# Patient Record
Sex: Female | Born: 1960 | Race: White | Hispanic: No | Marital: Married | Smoking: Former smoker
Health system: Southern US, Community
[De-identification: ages and names within clinical notes are randomized; demographics above are authoritative.]

## PROBLEM LIST (undated history)

## (undated) DIAGNOSIS — C801 Malignant (primary) neoplasm, unspecified: Secondary | ICD-10-CM

## (undated) DIAGNOSIS — I10 Essential (primary) hypertension: Secondary | ICD-10-CM

## (undated) DIAGNOSIS — Z923 Personal history of irradiation: Secondary | ICD-10-CM

## (undated) HISTORY — DX: Malignant (primary) neoplasm, unspecified: C80.1

## (undated) HISTORY — DX: Essential (primary) hypertension: I10

## (undated) HISTORY — PX: HERNIA REPAIR: SHX51

## (undated) HISTORY — PX: BREAST BIOPSY: SHX20

---

## 1998-06-07 ENCOUNTER — Encounter: Admission: RE | Admit: 1998-06-07 | Discharge: 1998-06-07 | Payer: Self-pay | Admitting: Family Medicine

## 1998-06-28 ENCOUNTER — Encounter: Admission: RE | Admit: 1998-06-28 | Discharge: 1998-06-28 | Payer: Self-pay | Admitting: Sports Medicine

## 1998-09-23 ENCOUNTER — Other Ambulatory Visit: Admission: RE | Admit: 1998-09-23 | Discharge: 1998-09-23 | Payer: Self-pay | Admitting: *Deleted

## 1999-02-14 ENCOUNTER — Encounter (HOSPITAL_COMMUNITY): Admission: RE | Admit: 1999-02-14 | Discharge: 1999-04-08 | Payer: Self-pay | Admitting: *Deleted

## 1999-03-09 ENCOUNTER — Inpatient Hospital Stay (HOSPITAL_COMMUNITY): Admission: AD | Admit: 1999-03-09 | Discharge: 1999-03-09 | Payer: Self-pay | Admitting: *Deleted

## 1999-03-14 ENCOUNTER — Ambulatory Visit (HOSPITAL_COMMUNITY): Admission: RE | Admit: 1999-03-14 | Discharge: 1999-03-14 | Payer: Self-pay | Admitting: Obstetrics and Gynecology

## 1999-04-04 ENCOUNTER — Inpatient Hospital Stay (HOSPITAL_COMMUNITY): Admission: AD | Admit: 1999-04-04 | Discharge: 1999-04-04 | Payer: Self-pay | Admitting: Obstetrics and Gynecology

## 1999-04-07 ENCOUNTER — Inpatient Hospital Stay (HOSPITAL_COMMUNITY): Admission: AD | Admit: 1999-04-07 | Discharge: 1999-04-09 | Payer: Self-pay | Admitting: *Deleted

## 1999-04-10 ENCOUNTER — Encounter (HOSPITAL_COMMUNITY): Admission: RE | Admit: 1999-04-10 | Discharge: 1999-07-09 | Payer: Self-pay | Admitting: *Deleted

## 1999-05-14 ENCOUNTER — Other Ambulatory Visit: Admission: RE | Admit: 1999-05-14 | Discharge: 1999-05-14 | Payer: Self-pay | Admitting: *Deleted

## 1999-07-16 ENCOUNTER — Encounter (HOSPITAL_COMMUNITY): Admission: RE | Admit: 1999-07-16 | Discharge: 1999-10-14 | Payer: Self-pay | Admitting: *Deleted

## 1999-10-12 ENCOUNTER — Encounter (HOSPITAL_COMMUNITY): Admission: RE | Admit: 1999-10-12 | Discharge: 2000-01-10 | Payer: Self-pay | Admitting: *Deleted

## 2000-06-16 ENCOUNTER — Other Ambulatory Visit: Admission: RE | Admit: 2000-06-16 | Discharge: 2000-06-16 | Payer: Self-pay | Admitting: *Deleted

## 2000-11-03 ENCOUNTER — Encounter: Admission: RE | Admit: 2000-11-03 | Discharge: 2000-11-03 | Payer: Self-pay | Admitting: Obstetrics & Gynecology

## 2000-11-03 ENCOUNTER — Encounter: Payer: Self-pay | Admitting: Obstetrics & Gynecology

## 2001-06-28 ENCOUNTER — Other Ambulatory Visit: Admission: RE | Admit: 2001-06-28 | Discharge: 2001-06-28 | Payer: Self-pay | Admitting: *Deleted

## 2002-07-18 ENCOUNTER — Other Ambulatory Visit: Admission: RE | Admit: 2002-07-18 | Discharge: 2002-07-18 | Payer: Self-pay | Admitting: Obstetrics & Gynecology

## 2003-03-07 ENCOUNTER — Encounter: Admission: RE | Admit: 2003-03-07 | Discharge: 2003-03-07 | Payer: Self-pay | Admitting: Obstetrics & Gynecology

## 2003-03-07 ENCOUNTER — Encounter: Payer: Self-pay | Admitting: Obstetrics & Gynecology

## 2003-09-04 ENCOUNTER — Other Ambulatory Visit: Admission: RE | Admit: 2003-09-04 | Discharge: 2003-09-04 | Payer: Self-pay | Admitting: Obstetrics & Gynecology

## 2003-09-10 ENCOUNTER — Encounter: Admission: RE | Admit: 2003-09-10 | Discharge: 2003-09-10 | Payer: Self-pay | Admitting: Obstetrics & Gynecology

## 2003-10-04 ENCOUNTER — Ambulatory Visit (HOSPITAL_COMMUNITY): Admission: RE | Admit: 2003-10-04 | Discharge: 2003-10-04 | Payer: Self-pay | Admitting: Gastroenterology

## 2004-06-11 ENCOUNTER — Encounter: Admission: RE | Admit: 2004-06-11 | Discharge: 2004-06-11 | Payer: Self-pay | Admitting: Obstetrics & Gynecology

## 2005-07-14 ENCOUNTER — Encounter: Admission: RE | Admit: 2005-07-14 | Discharge: 2005-07-14 | Payer: Self-pay | Admitting: Obstetrics & Gynecology

## 2005-07-27 ENCOUNTER — Encounter: Admission: RE | Admit: 2005-07-27 | Discharge: 2005-07-27 | Payer: Self-pay | Admitting: Obstetrics & Gynecology

## 2005-08-21 ENCOUNTER — Encounter: Admission: RE | Admit: 2005-08-21 | Discharge: 2005-08-21 | Payer: Self-pay | Admitting: Internal Medicine

## 2006-01-26 ENCOUNTER — Ambulatory Visit: Payer: Self-pay | Admitting: Sports Medicine

## 2006-07-22 ENCOUNTER — Encounter: Admission: RE | Admit: 2006-07-22 | Discharge: 2006-07-22 | Payer: Self-pay | Admitting: Obstetrics & Gynecology

## 2006-07-27 ENCOUNTER — Encounter: Admission: RE | Admit: 2006-07-27 | Discharge: 2006-07-27 | Payer: Self-pay | Admitting: Obstetrics & Gynecology

## 2007-01-05 ENCOUNTER — Encounter: Admission: RE | Admit: 2007-01-05 | Discharge: 2007-01-05 | Payer: Self-pay | Admitting: Obstetrics & Gynecology

## 2007-07-25 ENCOUNTER — Encounter: Admission: RE | Admit: 2007-07-25 | Discharge: 2007-07-25 | Payer: Self-pay | Admitting: Obstetrics & Gynecology

## 2008-08-27 ENCOUNTER — Encounter: Admission: RE | Admit: 2008-08-27 | Discharge: 2008-08-27 | Payer: Self-pay | Admitting: Internal Medicine

## 2008-08-27 ENCOUNTER — Encounter: Admission: RE | Admit: 2008-08-27 | Discharge: 2008-08-27 | Payer: Self-pay | Admitting: Obstetrics & Gynecology

## 2009-01-02 ENCOUNTER — Ambulatory Visit: Payer: Self-pay | Admitting: Sports Medicine

## 2009-01-02 ENCOUNTER — Encounter: Admission: RE | Admit: 2009-01-02 | Discharge: 2009-01-02 | Payer: Self-pay | Admitting: Sports Medicine

## 2009-01-02 DIAGNOSIS — M25559 Pain in unspecified hip: Secondary | ICD-10-CM

## 2009-01-02 DIAGNOSIS — M2170 Unequal limb length (acquired), unspecified site: Secondary | ICD-10-CM

## 2009-01-17 ENCOUNTER — Ambulatory Visit: Payer: Self-pay | Admitting: Sports Medicine

## 2009-11-02 DIAGNOSIS — C50919 Malignant neoplasm of unspecified site of unspecified female breast: Secondary | ICD-10-CM

## 2009-11-02 HISTORY — PX: BREAST LUMPECTOMY: SHX2

## 2009-11-02 HISTORY — DX: Malignant neoplasm of unspecified site of unspecified female breast: C50.919

## 2010-05-26 ENCOUNTER — Emergency Department (HOSPITAL_COMMUNITY): Admission: EM | Admit: 2010-05-26 | Discharge: 2010-05-26 | Payer: Self-pay | Admitting: Emergency Medicine

## 2010-07-30 ENCOUNTER — Encounter: Admission: RE | Admit: 2010-07-30 | Discharge: 2010-07-30 | Payer: Self-pay | Admitting: Obstetrics & Gynecology

## 2010-08-12 ENCOUNTER — Encounter: Admission: RE | Admit: 2010-08-12 | Discharge: 2010-08-12 | Payer: Self-pay | Admitting: Obstetrics & Gynecology

## 2010-08-13 DIAGNOSIS — C801 Malignant (primary) neoplasm, unspecified: Secondary | ICD-10-CM

## 2010-08-13 HISTORY — DX: Malignant (primary) neoplasm, unspecified: C80.1

## 2010-08-17 ENCOUNTER — Encounter: Admission: RE | Admit: 2010-08-17 | Discharge: 2010-08-17 | Payer: Self-pay | Admitting: Obstetrics & Gynecology

## 2010-08-21 ENCOUNTER — Encounter: Admission: RE | Admit: 2010-08-21 | Discharge: 2010-08-21 | Payer: Self-pay | Admitting: Obstetrics & Gynecology

## 2010-08-26 ENCOUNTER — Encounter: Admission: RE | Admit: 2010-08-26 | Discharge: 2010-08-26 | Payer: Self-pay | Admitting: General Surgery

## 2010-08-28 ENCOUNTER — Ambulatory Visit (HOSPITAL_BASED_OUTPATIENT_CLINIC_OR_DEPARTMENT_OTHER): Admission: RE | Admit: 2010-08-28 | Discharge: 2010-08-28 | Payer: Self-pay | Admitting: General Surgery

## 2010-08-28 ENCOUNTER — Encounter: Admission: RE | Admit: 2010-08-28 | Discharge: 2010-08-28 | Payer: Self-pay | Admitting: General Surgery

## 2010-08-28 HISTORY — PX: BREAST SURGERY: SHX581

## 2010-09-02 ENCOUNTER — Ambulatory Visit: Payer: Self-pay | Admitting: Oncology

## 2010-09-11 LAB — CBC WITH DIFFERENTIAL/PLATELET
BASO%: 0.4 % (ref 0.0–2.0)
HCT: 38.2 % (ref 34.8–46.6)
MCHC: 34.5 g/dL (ref 31.5–36.0)
MONO#: 0.4 10*3/uL (ref 0.1–0.9)
RBC: 4.18 10*6/uL (ref 3.70–5.45)
WBC: 8.1 10*3/uL (ref 3.9–10.3)
lymph#: 2 10*3/uL (ref 0.9–3.3)

## 2010-09-11 LAB — COMPREHENSIVE METABOLIC PANEL
ALT: 11 U/L (ref 0–35)
CO2: 23 mEq/L (ref 19–32)
Calcium: 9.7 mg/dL (ref 8.4–10.5)
Chloride: 105 mEq/L (ref 96–112)
Sodium: 140 mEq/L (ref 135–145)
Total Protein: 7.3 g/dL (ref 6.0–8.3)

## 2010-09-11 LAB — CANCER ANTIGEN 27.29: CA 27.29: 30 U/mL (ref 0–39)

## 2010-09-16 ENCOUNTER — Ambulatory Visit
Admission: RE | Admit: 2010-09-16 | Discharge: 2010-11-18 | Payer: Self-pay | Source: Home / Self Care | Attending: Radiation Oncology | Admitting: Radiation Oncology

## 2010-09-17 ENCOUNTER — Ambulatory Visit (HOSPITAL_COMMUNITY): Admission: RE | Admit: 2010-09-17 | Discharge: 2010-09-17 | Payer: Self-pay | Admitting: Oncology

## 2010-10-06 ENCOUNTER — Ambulatory Visit: Payer: Self-pay | Admitting: Oncology

## 2010-10-13 ENCOUNTER — Ambulatory Visit: Payer: Self-pay | Admitting: Genetic Counselor

## 2010-11-12 ENCOUNTER — Ambulatory Visit: Payer: Self-pay | Admitting: Oncology

## 2010-11-22 ENCOUNTER — Encounter: Payer: Self-pay | Admitting: Obstetrics & Gynecology

## 2010-11-23 ENCOUNTER — Encounter: Payer: Self-pay | Admitting: Obstetrics & Gynecology

## 2010-12-18 ENCOUNTER — Ambulatory Visit: Payer: 59 | Attending: Radiation Oncology | Admitting: Radiation Oncology

## 2010-12-19 ENCOUNTER — Other Ambulatory Visit: Payer: Self-pay | Admitting: Radiation Oncology

## 2010-12-19 DIAGNOSIS — Z9889 Other specified postprocedural states: Secondary | ICD-10-CM

## 2011-01-02 ENCOUNTER — Other Ambulatory Visit: Payer: Self-pay | Admitting: Obstetrics & Gynecology

## 2011-01-06 ENCOUNTER — Encounter (HOSPITAL_BASED_OUTPATIENT_CLINIC_OR_DEPARTMENT_OTHER): Payer: BC Managed Care – PPO | Admitting: Oncology

## 2011-01-06 ENCOUNTER — Other Ambulatory Visit: Payer: Self-pay | Admitting: Oncology

## 2011-01-06 DIAGNOSIS — C50919 Malignant neoplasm of unspecified site of unspecified female breast: Secondary | ICD-10-CM

## 2011-01-06 LAB — CBC WITH DIFFERENTIAL/PLATELET
Basophils Absolute: 0 10*3/uL (ref 0.0–0.1)
Eosinophils Absolute: 0.2 10*3/uL (ref 0.0–0.5)
HGB: 12.7 g/dL (ref 11.6–15.9)
MCV: 89.1 fL (ref 79.5–101.0)
MONO#: 0.4 10*3/uL (ref 0.1–0.9)
MONO%: 8.1 % (ref 0.0–14.0)
NEUT#: 2.8 10*3/uL (ref 1.5–6.5)
RBC: 4.06 10*6/uL (ref 3.70–5.45)
RDW: 13.2 % (ref 11.2–14.5)
WBC: 4.3 10*3/uL (ref 3.9–10.3)
lymph#: 0.9 10*3/uL (ref 0.9–3.3)

## 2011-01-06 LAB — COMPREHENSIVE METABOLIC PANEL
Albumin: 4.3 g/dL (ref 3.5–5.2)
Alkaline Phosphatase: 30 U/L — ABNORMAL LOW (ref 39–117)
Calcium: 9.2 mg/dL (ref 8.4–10.5)
Chloride: 106 mEq/L (ref 96–112)
Glucose, Bld: 111 mg/dL — ABNORMAL HIGH (ref 70–99)
Potassium: 4.4 mEq/L (ref 3.5–5.3)
Sodium: 139 mEq/L (ref 135–145)
Total Protein: 6.6 g/dL (ref 6.0–8.3)

## 2011-01-12 ENCOUNTER — Other Ambulatory Visit: Payer: Self-pay | Admitting: Obstetrics & Gynecology

## 2011-01-13 ENCOUNTER — Encounter (HOSPITAL_BASED_OUTPATIENT_CLINIC_OR_DEPARTMENT_OTHER): Payer: BC Managed Care – PPO | Admitting: Oncology

## 2011-01-13 DIAGNOSIS — C50919 Malignant neoplasm of unspecified site of unspecified female breast: Secondary | ICD-10-CM

## 2011-03-02 ENCOUNTER — Encounter (INDEPENDENT_AMBULATORY_CARE_PROVIDER_SITE_OTHER): Payer: Self-pay | Admitting: General Surgery

## 2011-03-04 ENCOUNTER — Other Ambulatory Visit: Payer: Self-pay | Admitting: Obstetrics & Gynecology

## 2011-03-04 ENCOUNTER — Encounter (HOSPITAL_COMMUNITY): Payer: BC Managed Care – PPO

## 2011-03-04 LAB — COMPREHENSIVE METABOLIC PANEL
Albumin: 3.6 g/dL (ref 3.5–5.2)
BUN: 12 mg/dL (ref 6–23)
Creatinine, Ser: 0.68 mg/dL (ref 0.4–1.2)
GFR calc Af Amer: >60 mL/min (ref >60)
Potassium: 4.4 mEq/L (ref 3.5–5.1)
Total Protein: 6.6 g/dL (ref 6.0–8.3)

## 2011-03-04 LAB — CBC
MCH: 29.8 pg (ref 26.0–34.0)
MCV: 91.3 fL (ref 78.0–100.0)
Platelets: 242 10*3/uL (ref 150–400)
RDW: 13 % (ref 11.5–15.5)
WBC: 5.2 10*3/uL (ref 4.0–10.5)

## 2011-03-04 LAB — SURGICAL PCR SCREEN: MRSA, PCR: NEGATIVE

## 2011-03-11 ENCOUNTER — Other Ambulatory Visit: Payer: Self-pay | Admitting: Obstetrics & Gynecology

## 2011-03-11 ENCOUNTER — Ambulatory Visit (HOSPITAL_COMMUNITY)
Admission: AD | Admit: 2011-03-11 | Discharge: 2011-03-11 | Disposition: A | Discharge status: Home / Self Care | Payer: BC Managed Care – PPO | Source: Ambulatory Visit | Attending: Obstetrics & Gynecology | Admitting: Obstetrics & Gynecology

## 2011-03-11 DIAGNOSIS — Z170 Estrogen receptor positive status [ER+]: Secondary | ICD-10-CM | POA: Insufficient documentation

## 2011-03-11 DIAGNOSIS — Z8041 Family history of malignant neoplasm of ovary: Secondary | ICD-10-CM | POA: Insufficient documentation

## 2011-03-11 DIAGNOSIS — Z853 Personal history of malignant neoplasm of breast: Secondary | ICD-10-CM | POA: Insufficient documentation

## 2011-03-11 DIAGNOSIS — Z4002 Encounter for prophylactic removal of ovary: Secondary | ICD-10-CM | POA: Insufficient documentation

## 2011-03-11 DIAGNOSIS — Z01818 Encounter for other preprocedural examination: Secondary | ICD-10-CM | POA: Insufficient documentation

## 2011-03-11 DIAGNOSIS — Z01812 Encounter for preprocedural laboratory examination: Secondary | ICD-10-CM | POA: Insufficient documentation

## 2011-03-11 DIAGNOSIS — N83209 Unspecified ovarian cyst, unspecified side: Secondary | ICD-10-CM | POA: Insufficient documentation

## 2011-03-11 LAB — PREGNANCY, URINE: Preg Test, Ur: NEGATIVE

## 2011-03-11 LAB — ABO/RH: ABO/RH(D): A POS

## 2011-03-15 LAB — CROSSMATCH: Unit division: 0

## 2011-03-20 NOTE — Op Note (Signed)
NAMEKENLEIGH, Dana Moran                         ACCOUNT NO.:  [id number]   MEDICAL RECORD NO.:  [id number]                   PATIENT TYPE:  AMB   LOCATION:  ENDO                                 FACILITY:   PHYSICIAN:  Anselmo Rod, M.D.               DATE OF BIRTH:  December 03, 1960   DATE OF PROCEDURE:  10/04/2003  DATE OF DISCHARGE:                                 OPERATIVE REPORT   PROCEDURE PERFORMED:  Screening colonoscopy.   ENDOSCOPIST:  Anselmo Rod, M.D.   INSTRUMENT USED:  Olympus video colonoscope.   INDICATIONS FOR PROCEDURE:  Rectal bleeding with guaiac-positive stools on  physical examination in the office and abnormal digital examination.  Question extrinsic compression of the rectum versus rectal polyp and family  history of colon cancer in a 50 year old white female.  Rule out colonic  polyps, masses, etc.   PREPROCEDURE PREPARATION:  Informed consent was procured from the patient.  The patient was fasted for eight hours prior to procedure and prepped with a  bottle of magnesium citrate and gallon of GoLYTELY the night prior to  procedure.   PREPROCEDURE PHYSICAL:  VITAL SIGNS:  The patient has stable vital signs.  NECK:  Supple.  CHEST:  Clear to auscultation.  CARDIAC:  S1, S2 regular.  ABDOMEN:  Soft with normal bowel sounds.   DESCRIPTION OF PROCEDURE:  The patient was placed in the left lateral  decubitus position and sedated with 80 mg of Demerol and 8 mg of Versed in  slow incremental doses.  Once the patient was adequately sedated and  maintained on low-flow oxygen and continuous cardiac monitoring, the Olympus  video colonoscope was advanced from the rectum to the cecum without  difficulty except for some extrinsic compression of the rectum.  No other  abnormalities were noted up to the cecum.  The appendicular orifice and  ileocecal valve were clearly visualized and photographed.  Small internal  hemorrhoids were seen on retroflexion in the  rectum.   IMPRESSION:  1. Normal colonoscopy up to the cecum except for small internal hemorrhoids.  2. Extrinsic compression of the rectum.   RECOMMENDATIONS:  1. Repeat CRC screening as recommended in the next five years unless the     patient develops any abnormal symptoms in the interim.  2. Repeat guaiac on outpatient basis.  3. Pelvic ultrasound with next GYN evaluation to evaluate extrinsic     compression of rectum.  This can be done at Dr. Sharol Roussel office.  4.     Outpatient followup in the next four weeks for further recommendations.     Continue high-fiber diet with liberal fluid intake.                                               Jyothi Nat  Loreta Ave, M.D.    JNM/MEDQ  D:  10/04/2003  T:  10/05/2003  Job:  [id number]   cc:   Genia Del, M.D.  [address]  [place]  [place] [address]  Fax: [phone]

## 2011-04-01 NOTE — Op Note (Signed)
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  PROCEDURE:  Bilateral salpingo-oophorectomy with lysis of adhesions, peritoneal washings assisted with [employer] robot.  SURGEON:  Genia Del, MD  ASSISTANT:  Arlan Organ, MD  ANESTHESIOLOGIST:  Quillian Quince, MD  PROCEDURE:  Under general anesthesia with endotracheal intubation, the patient is in lithotomy position.  She is prepped with Surgi-Prep on the abdomen and Betadine on the suprapubic, vulvar, and vaginal areas.  The Foley is put in place in the bladder, and the patient is draped as usual.  The vaginal exam reveals a retroverted uterus, normal volume. No adnexal mass felt.  The weighted speculum was inserted in the vagina. The anterior lip of the cervix was grasped with a tenaculum.  The hysterometry is at 9 cm.  We dilated the cervix with Hegar dilators up to #25 without difficulty.  We then used a #8 Rumi with a small KOH ring.  This is put in place as usual without problems.  We removed the tenaculum and speculum.  We go to the abdomen.  We go at the palmar point.  We infiltrated the subcutaneous tissue with Marcaine 0.25% plain 5 mL.  We made a 5-mm incision with a scalpel.  The Veress needle was inserted.  Security tests are done and a pneumoperitoneum  was created with about 3 liters of CO2.  We then removed the Veress needle.  We inserted the 5-mm trocar and the 5-mm camera at that level.  No adhesion is present with the anterior wall in the abdomen or pelvis.  The uterus is normal in appearance.  We therefore inserted all the other ports.  We used an M configuration, and we put only two robotic arms.  We made the measurements.  We infiltrated the skin with Marcaine 0.25% plain, a total of an additional 10 mL.  We used a scalpel to make a 10-mm incision at the supraumbilical area, and 8-mm incisions on the right and the left.  We inserted all trocars under direct vision.  We then put the patient in Trendelenburg.  We used the Nezhat to do peritoneal washings, and this will be sent to cytology.  We then removed the instruments.  We docked the robot on the right side.  We put the EndoShears scissor on the first arm and the PK in the second arm.  We then go to the console. We released the sigmoid colon on the left side.  Fine adhesions were present.  We visualized the left ureter very  very well.  It is in normal anatomic position.  We have just a very filmy adhesion between the right ovary in the right pelvic wall.  This was released with the EndoShears scissors.  We cauterized and sectioned the infundibulopelvic ligament as distally as possible.  We then had coag, and sectioned the left utero- ovarian ligament and the proximal aspect of the left tube.  We completely detached the left adnexa and put it in the posterior cul-de- sac.  Hemostasis was adequate on that side.  We then proceed exactly the same way on the right side.  There is a simple cyst measuring about 3 cm is present.  It drains during the procedure and a clear fluid evacuates. The right ureter is in normal anatomic position.  We cauterized and sectioned the right infundibulopelvic ligament as distally as possible. We cauterized and sectioned the right utero-ovarian ligament,  the right tube proximally, and we completely detached the right adnexa.  When we finished detaching the right adnexa, the ovary was adherent to the ovarian fossa and that was freed very carefully.  We put the specimen in the cul-de-sac.  Hemostasis was verified.  We irrigated and suctioned. We used the tip of the EndoShears scissors and PK to complete fine hemostasis.  We then removed robotic instruments, undocked the robot. We go by laparoscopy with a 5-mm camera in the assistant port, and the Endobag in the suprapubic port.  We removed both adnexa in the bag and sent it to Pathology.  Hemostasis was adequate at all levels. Irrigation and suction was done.  We removed all instruments, removed the trocars, and evacuated the CO2.  We closed the supraumbilical incision with a running suture of Vicryl 0 at the aponeurosis.  We closed all incisions with a subcuticular stitch of Vicryl 4-0 and apply Dermabond on all incisions.  We also removed the KOH ring and Rumi from the vagina.  The estimated blood loss was minimal.  The patient received Ancef 1 g IV before induction.  The count of instruments and sponges was complete.  No complications occurred, and she was brought to recovery room in good stable status.     Genia Del, M.D.     ML/MEDQ  D:  03/11/2011  T:  03/12/2011  Job:  [id number]  Electronically Signed by Genia Del M.D. on 04/01/2011 05:04:22 PM

## 2011-04-09 ENCOUNTER — Other Ambulatory Visit: Payer: Self-pay | Admitting: Oncology

## 2011-04-09 ENCOUNTER — Encounter (HOSPITAL_BASED_OUTPATIENT_CLINIC_OR_DEPARTMENT_OTHER): Payer: BC Managed Care – PPO | Admitting: Oncology

## 2011-04-09 DIAGNOSIS — C50919 Malignant neoplasm of unspecified site of unspecified female breast: Secondary | ICD-10-CM

## 2011-04-09 LAB — COMPREHENSIVE METABOLIC PANEL
ALT: 16 U/L (ref 0–35)
AST: 15 U/L (ref 0–37)
Albumin: 4.3 g/dL (ref 3.5–5.2)
Alkaline Phosphatase: 41 U/L (ref 39–117)
Potassium: 4.7 mEq/L (ref 3.5–5.3)
Sodium: 140 mEq/L (ref 135–145)
Total Protein: 6.7 g/dL (ref 6.0–8.3)

## 2011-04-09 LAB — CBC WITH DIFFERENTIAL/PLATELET
EOS%: 4.9 % (ref 0.0–7.0)
MCH: 31.1 pg (ref 25.1–34.0)
MCV: 89.7 fL (ref 79.5–101.0)
MONO%: 7.2 % (ref 0.0–14.0)
NEUT#: 2.5 10*3/uL (ref 1.5–6.5)
RBC: 4.13 10*6/uL (ref 3.70–5.45)
RDW: 12.9 % (ref 11.2–14.5)
lymph#: 1.2 10*3/uL (ref 0.9–3.3)

## 2011-04-10 ENCOUNTER — Other Ambulatory Visit: Payer: Self-pay | Admitting: Dermatology

## 2011-04-16 ENCOUNTER — Other Ambulatory Visit: Payer: Self-pay | Admitting: Oncology

## 2011-04-16 ENCOUNTER — Other Ambulatory Visit: Payer: Self-pay | Admitting: Radiation Oncology

## 2011-04-16 ENCOUNTER — Encounter (HOSPITAL_BASED_OUTPATIENT_CLINIC_OR_DEPARTMENT_OTHER): Payer: BC Managed Care – PPO | Admitting: Oncology

## 2011-04-16 DIAGNOSIS — Z170 Estrogen receptor positive status [ER+]: Secondary | ICD-10-CM

## 2011-04-16 DIAGNOSIS — C50919 Malignant neoplasm of unspecified site of unspecified female breast: Secondary | ICD-10-CM

## 2011-04-16 DIAGNOSIS — N6459 Other signs and symptoms in breast: Secondary | ICD-10-CM

## 2011-04-16 DIAGNOSIS — R232 Flushing: Secondary | ICD-10-CM

## 2011-04-16 DIAGNOSIS — C50419 Malignant neoplasm of upper-outer quadrant of unspecified female breast: Secondary | ICD-10-CM

## 2011-04-23 ENCOUNTER — Ambulatory Visit
Admission: RE | Admit: 2011-04-23 | Discharge: 2011-04-23 | Disposition: A | Discharge status: Home / Self Care | Payer: BC Managed Care – PPO | Source: Ambulatory Visit | Attending: Oncology | Admitting: Oncology

## 2011-04-23 DIAGNOSIS — C50919 Malignant neoplasm of unspecified site of unspecified female breast: Secondary | ICD-10-CM

## 2011-05-12 ENCOUNTER — Other Ambulatory Visit: Payer: Self-pay | Admitting: Dermatology

## 2011-06-16 ENCOUNTER — Encounter (HOSPITAL_BASED_OUTPATIENT_CLINIC_OR_DEPARTMENT_OTHER): Payer: BC Managed Care – PPO | Admitting: Oncology

## 2011-06-16 DIAGNOSIS — R232 Flushing: Secondary | ICD-10-CM

## 2011-06-16 DIAGNOSIS — Z170 Estrogen receptor positive status [ER+]: Secondary | ICD-10-CM

## 2011-06-16 DIAGNOSIS — C50419 Malignant neoplasm of upper-outer quadrant of unspecified female breast: Secondary | ICD-10-CM

## 2011-08-12 ENCOUNTER — Other Ambulatory Visit: Payer: Self-pay | Admitting: Oncology

## 2011-08-12 DIAGNOSIS — Z853 Personal history of malignant neoplasm of breast: Secondary | ICD-10-CM

## 2011-08-12 DIAGNOSIS — Z9889 Other specified postprocedural states: Secondary | ICD-10-CM

## 2011-08-17 ENCOUNTER — Other Ambulatory Visit: Payer: Self-pay | Admitting: Oncology

## 2011-08-17 ENCOUNTER — Ambulatory Visit
Admission: RE | Admit: 2011-08-17 | Discharge: 2011-08-17 | Disposition: A | Discharge status: Home / Self Care | Payer: BC Managed Care – PPO | Source: Ambulatory Visit | Attending: Radiation Oncology | Admitting: Radiation Oncology

## 2011-08-17 DIAGNOSIS — R921 Mammographic calcification found on diagnostic imaging of breast: Secondary | ICD-10-CM

## 2011-08-17 DIAGNOSIS — Z9889 Other specified postprocedural states: Secondary | ICD-10-CM

## 2011-08-18 ENCOUNTER — Ambulatory Visit
Admission: RE | Admit: 2011-08-18 | Discharge: 2011-08-18 | Disposition: A | Discharge status: Home / Self Care | Payer: BC Managed Care – PPO | Source: Ambulatory Visit | Attending: Oncology | Admitting: Oncology

## 2011-08-18 ENCOUNTER — Other Ambulatory Visit: Payer: Self-pay | Admitting: Oncology

## 2011-08-18 DIAGNOSIS — R921 Mammographic calcification found on diagnostic imaging of breast: Secondary | ICD-10-CM

## 2011-08-18 DIAGNOSIS — Z9889 Other specified postprocedural states: Secondary | ICD-10-CM

## 2011-08-20 ENCOUNTER — Ambulatory Visit
Admission: RE | Admit: 2011-08-20 | Discharge: 2011-08-20 | Disposition: A | Discharge status: Home / Self Care | Payer: BC Managed Care – PPO | Source: Ambulatory Visit | Attending: Radiation Oncology | Admitting: Radiation Oncology

## 2011-09-04 ENCOUNTER — Ambulatory Visit
Admission: RE | Admit: 2011-09-04 | Discharge: 2011-09-04 | Disposition: A | Discharge status: Home / Self Care | Payer: BC Managed Care – PPO | Source: Ambulatory Visit | Attending: Oncology | Admitting: Oncology

## 2011-09-04 DIAGNOSIS — Z9889 Other specified postprocedural states: Secondary | ICD-10-CM

## 2011-09-04 DIAGNOSIS — Z853 Personal history of malignant neoplasm of breast: Secondary | ICD-10-CM

## 2011-09-04 MED ORDER — GADOBENATE DIMEGLUMINE 529 MG/ML IV SOLN: 11.0000 mL | Freq: Once | INTRAVENOUS | Status: AC | PRN

## 2011-10-14 ENCOUNTER — Encounter: Payer: Self-pay | Admitting: Sports Medicine

## 2011-10-14 ENCOUNTER — Ambulatory Visit (INDEPENDENT_AMBULATORY_CARE_PROVIDER_SITE_OTHER): Payer: BC Managed Care – PPO | Admitting: Sports Medicine

## 2011-10-14 VITALS — BP 126/84 | HR 72 | Ht 62.5 in | Wt 122.0 lb

## 2011-10-14 DIAGNOSIS — M25512 Pain in left shoulder: Secondary | ICD-10-CM | POA: Insufficient documentation

## 2011-10-14 DIAGNOSIS — M25519 Pain in unspecified shoulder: Secondary | ICD-10-CM

## 2011-10-14 DIAGNOSIS — M719 Bursopathy, unspecified: Secondary | ICD-10-CM

## 2011-10-14 DIAGNOSIS — M75102 Unspecified rotator cuff tear or rupture of left shoulder, not specified as traumatic: Secondary | ICD-10-CM | POA: Insufficient documentation

## 2011-10-14 MED ORDER — NITROGLYCERIN 0.2 MG/HR TD PT24: MEDICATED_PATCH | TRANSDERMAL | Status: DC

## 2011-10-14 NOTE — Assessment & Plan Note (Signed)
She will use very light Thera-Band and motion exercises at first since easy internal rotation does cause her some pain  When necessary use of Advil orally

## 2011-10-14 NOTE — Patient Instructions (Signed)
You have a small tear in your left supraspinatus tendon in your left shoulder  Please start applying 1/4 of a nitroglycerin patch to your left shoulder everyday   Nitroglycerin patches may cause headache for the first 1-2 weeks, it is ok to take ibuprofen for this  Do suggested exercises for shoulder daily  Please follow up in 4-6 weeks for re-scan of your shoulder  Thank you for seeing [place] today!

## 2011-10-14 NOTE — Progress Notes (Signed)
  Subjective:    Patient ID: Dana Moran, female    DOB: 15-Sep-1961, 50 y.o.   MRN: [id number]  HPI  Pt presents to clinic for evaluation of bilat shoulder pain L>R. Started 6 months ago, no injury. L shoulder aches most of the time- wakes her at night due to pain.  Has significant pain with abduction and ER on lt. Rt shoulder pops and catches, but pain is not severe.  Yoga and walking are main exercise. Takes Advil for pain as needed, which is helpful.   Dx with breast CA 14 months ago, was on tamoxifen for 6 months, now on letrozole for the last 6 months. Had lumpectomy on left, also had oophorectomy 2/2 family hx of ovarian CA.   Review of Systems     Objective:   Physical Exam  Shoulder appearance is symmetrical No scapular asymmetry  Speeds neg  bicepital groove non tender Yergason's neg Empty can neg  Hawkins slightly painful on lt  Neer's painful on lt, neg on rt  Back scratch limited on lt  IR and ER strong bilat IR at 90 deg slightly painful on lt O'brein's neg   [facility] ultrasound The left shoulder is visualized and the infraspinous and teres minor are normal The bicipital tendon is normal Supraspinalis is normal AC joint is normal  In the subscapularis there is a small linear split up that looks to be about a half a centimeter in length and it has some associated calcification There is some hypoechoic change with this This does not gap or impinge with motion      Assessment & Plan:

## 2011-10-14 NOTE — Assessment & Plan Note (Signed)
After a couple weeks of easy motion exercises we will start her on standard rotator cuff rehabilitation exercises with very light weight  We will start her on nitroglycerin protocol  She was warned about possible side effects  Recheck this in 6 weeks with a repeat scan

## 2011-11-18 ENCOUNTER — Ambulatory Visit (INDEPENDENT_AMBULATORY_CARE_PROVIDER_SITE_OTHER): Payer: BC Managed Care – PPO | Admitting: Sports Medicine

## 2011-11-18 DIAGNOSIS — M67919 Unspecified disorder of synovium and tendon, unspecified shoulder: Secondary | ICD-10-CM

## 2011-11-18 DIAGNOSIS — M75102 Unspecified rotator cuff tear or rupture of left shoulder, not specified as traumatic: Secondary | ICD-10-CM

## 2011-11-18 MED ORDER — NITROGLYCERIN 0.2 MG/HR TD PT24: MEDICATED_PATCH | TRANSDERMAL | Status: DC

## 2011-11-18 NOTE — Assessment & Plan Note (Signed)
With pain at night. Known small tear in the subscapularis tendon. Cortisone shot as above. Increase nitroglycerin to one half patch. We will see her back in 4 weeks. She will continue her home rehabilitation, but avoid exercises with arm in abduction.

## 2011-11-18 NOTE — Progress Notes (Signed)
  Subjective:    Patient ID: Dana Moran, female    DOB: 1961-06-12, 51 y.o.   MRN: [id number]  HPI This patient comes in for followup of left shoulder pain that she's been having for some time now. She has a diagnosis of a small tear in the subscapularis tendon. She's been using one quarter nitroglycerin patch, as well as some anti-inflammatories. She's never had a corticosteroid injection. Really she is waking up at night with the pain, and is overall worse.  She thinks she may have aggravated this with RC rehab exercises in ER  Original injury was from adducted shoulder strength move in yoga   Review of Systems    No fevers, chills, night sweats, weight loss, chest pain, or shortness of breath.  Objective:   Physical Exam General:  Well developed, well nourished, and in no acute distress. Neuro:  Alert and oriented x3, extra-ocular muscles intact. Skin: Warm and dry, no rashes noted.  Left shoulder: Positive Neer, positive Hawkins, positive empty can without weakness. Negative speed, negative Yergason. Pain with resisted internal rotation with a positive liftoff test. Good range of motion otherwise. Negative O'Brien's. Negative crossarm.  [facility] ultrasound: Again we see a small tear in the subscapularis tendon, and this is much smaller. Other RC MM looks intact Bicipital tendon normal  Consent obtained and verified. Time-out conducted. Noted no overlying erythema, induration, or other signs of local infection. Skin prepped in a sterile fashion. Topical analgesic spray: Ethyl chloride. Joint: Left subacromial joint Needle: 25-gauge Completed without difficulty. Meds: 1 cc Depo-Medrol 40, 3 cc lidocaine Pain immediately improved suggesting accurate placement of the medication. Advised to call if fevers/chills, erythema, induration, drainage, or persistent bleeding.     Assessment & Plan:

## 2011-11-18 NOTE — Patient Instructions (Signed)
Great to see you. Your subscapularis tear is better. Please rehabilitation exercises but avoid exercises with the arm significantly behind. Increased your nitroglycerin to one half patch. Come back to see [place] in 4 weeks.

## 2011-11-25 ENCOUNTER — Ambulatory Visit (INDEPENDENT_AMBULATORY_CARE_PROVIDER_SITE_OTHER): Payer: Self-pay | Admitting: General Surgery

## 2011-12-16 ENCOUNTER — Ambulatory Visit: Payer: BC Managed Care – PPO | Admitting: Sports Medicine

## 2011-12-23 ENCOUNTER — Encounter: Payer: Self-pay | Admitting: Sports Medicine

## 2011-12-23 ENCOUNTER — Ambulatory Visit (INDEPENDENT_AMBULATORY_CARE_PROVIDER_SITE_OTHER): Payer: BC Managed Care – PPO | Admitting: Sports Medicine

## 2011-12-23 VITALS — BP 141/86 | HR 67

## 2011-12-23 DIAGNOSIS — M75102 Unspecified rotator cuff tear or rupture of left shoulder, not specified as traumatic: Secondary | ICD-10-CM

## 2011-12-23 DIAGNOSIS — M25512 Pain in left shoulder: Secondary | ICD-10-CM

## 2011-12-23 DIAGNOSIS — M25519 Pain in unspecified shoulder: Secondary | ICD-10-CM

## 2011-12-23 DIAGNOSIS — M67919 Unspecified disorder of synovium and tendon, unspecified shoulder: Secondary | ICD-10-CM

## 2011-12-23 NOTE — Patient Instructions (Addendum)
Start doing shoulder range of motion exercises - do them to mild pain only  We will send a referral for physical therapy.  They will call you to schedule your appointment  Please follow up after you have had 8 PT sessions- in 4-5 weeks  Thank you for seeing [place] today!

## 2011-12-23 NOTE — Assessment & Plan Note (Addendum)
This has improved clinically from the level of having less pain and the ultrasound the area of the subscapularis muscle tear appears to be healed.  We will continue the nitroglycerin patches for at least 4 more weeks  She has developed limited range of motion -  This may be early evidence for adhesive capsulitis following her tear She is given home range of motion exercises in the water to refer her to physical therapy to try to improve her motion She will need to be cautious with strength work until her motion improves  I am concerned that some of the tightness in the axillary area may be residual from her she has had breast cancer surgery  I want to recheck her in 4-5 weeks after she has had 8 sessions of physical therapy

## 2011-12-23 NOTE — Progress Notes (Signed)
  Subjective:    Patient ID: Dana Moran, female    DOB: May 04, 1961, 51 y.o.   MRN: [id number]  HPI  Pt presents to clinic for f/u of lt shoulder pain which she reports is 50% improved. Using NTG 1/2 patch daily without problems. Does home exercises daily. Takes ibuprofen as needed which helps. Has pain with abduction and elevation above 90 deg. No longer wakes up 2/2 shoulder pain, only wakes her if she lays on her shoulder too long.   Review of Systems     Objective:   Physical Exam  NAD Lt shoulder exam: Lacks few degrees of full flexion on lt ante Full abduction and elevation but has tightness in axilla Limited back scratch on lt Good ER and IR strength at 90 deg Good IR and ER strength at 90 deg  Elevation strength good Good strength on push off test Limited ER, and abduction at 45 deg when lying flat on lt, but not on rt Gets to 70 deg ER on lt, and 90 deg on rt  [facility] ultrasound The area of hypoechoic change in splitting in the subscapularis tendon appears to be healed Bicipital tendon unremarkable Infraspinatus supraspinatus and teres minor look normal A.c. joint normal Ultrasound evaluation is essentially normalized        Assessment & Plan:

## 2012-01-04 ENCOUNTER — Ambulatory Visit: Payer: BC Managed Care – PPO | Attending: Sports Medicine | Admitting: Physical Therapy

## 2012-01-04 DIAGNOSIS — IMO0001 Reserved for inherently not codable concepts without codable children: Secondary | ICD-10-CM | POA: Insufficient documentation

## 2012-01-04 DIAGNOSIS — M25619 Stiffness of unspecified shoulder, not elsewhere classified: Secondary | ICD-10-CM | POA: Insufficient documentation

## 2012-01-04 DIAGNOSIS — M25519 Pain in unspecified shoulder: Secondary | ICD-10-CM | POA: Insufficient documentation

## 2012-01-06 ENCOUNTER — Ambulatory Visit: Payer: BC Managed Care – PPO | Admitting: Physical Therapy

## 2012-01-06 ENCOUNTER — Encounter: Payer: BC Managed Care – PPO | Admitting: Physical Therapy

## 2012-01-12 ENCOUNTER — Other Ambulatory Visit (HOSPITAL_BASED_OUTPATIENT_CLINIC_OR_DEPARTMENT_OTHER): Payer: BC Managed Care – PPO | Admitting: Lab

## 2012-01-12 ENCOUNTER — Ambulatory Visit: Payer: BC Managed Care – PPO | Admitting: Physical Therapy

## 2012-01-12 DIAGNOSIS — C50919 Malignant neoplasm of unspecified site of unspecified female breast: Secondary | ICD-10-CM

## 2012-01-13 LAB — COMPREHENSIVE METABOLIC PANEL
AST: 15 U/L (ref 0–37)
Alkaline Phosphatase: 49 U/L (ref 39–117)
BUN: 13 mg/dL (ref 6–23)
Calcium: 10.5 mg/dL (ref 8.4–10.5)
Creatinine, Ser: 0.76 mg/dL (ref 0.50–1.10)
Total Bilirubin: 0.4 mg/dL (ref 0.3–1.2)

## 2012-01-14 ENCOUNTER — Ambulatory Visit: Payer: BC Managed Care – PPO | Admitting: Physical Therapy

## 2012-01-15 ENCOUNTER — Encounter (INDEPENDENT_AMBULATORY_CARE_PROVIDER_SITE_OTHER): Payer: Self-pay | Admitting: General Surgery

## 2012-01-15 ENCOUNTER — Ambulatory Visit (INDEPENDENT_AMBULATORY_CARE_PROVIDER_SITE_OTHER): Payer: BC Managed Care – PPO | Admitting: General Surgery

## 2012-01-15 VITALS — BP 128/73 | HR 77 | Temp 98.0°F | Ht 62.5 in | Wt 127.4 lb

## 2012-01-15 DIAGNOSIS — C50912 Malignant neoplasm of unspecified site of left female breast: Secondary | ICD-10-CM

## 2012-01-15 DIAGNOSIS — C50919 Malignant neoplasm of unspecified site of unspecified female breast: Secondary | ICD-10-CM

## 2012-01-15 NOTE — Progress Notes (Signed)
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 Patient has a family history of ovarian cancer and underwent bilateral salpingo-oophorectomy. She reports no problems relation to her breast. No lumps or skin changes or nipple discharge. She has no arm swelling. 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 She also had a large core needle biopsy in October due to calcification seen on her postoperative mammogram but this was negative showing only fibrocystic disease.  Exam: Gen.: Well-appearing female Skin: No rash or infection Lymph nodes: No palpable cervical, supraclavicular, or axillary lymph nodes Breasts: There is some mild thickening at the lumpectomy site in the lateral left breast. Minimal post radiation changes. No other masses or abnormalities palpable in either breast.  Assessment and plan: Doing well without evidence of new or recurrent disease. I asked her to return in 6 months. `

## 2012-01-18 ENCOUNTER — Ambulatory Visit: Payer: BC Managed Care – PPO | Admitting: Physical Therapy

## 2012-01-19 ENCOUNTER — Ambulatory Visit: Payer: BC Managed Care – PPO | Admitting: Oncology

## 2012-01-20 ENCOUNTER — Ambulatory Visit: Payer: BC Managed Care – PPO | Admitting: Physical Therapy

## 2012-01-26 ENCOUNTER — Ambulatory Visit: Payer: BC Managed Care – PPO | Admitting: Physical Therapy

## 2012-01-28 ENCOUNTER — Ambulatory Visit: Payer: BC Managed Care – PPO | Admitting: Physical Therapy

## 2012-02-02 ENCOUNTER — Ambulatory Visit (HOSPITAL_BASED_OUTPATIENT_CLINIC_OR_DEPARTMENT_OTHER): Payer: BC Managed Care – PPO | Admitting: Oncology

## 2012-02-02 ENCOUNTER — Telehealth: Payer: Self-pay | Admitting: *Deleted

## 2012-02-02 VITALS — BP 153/91 | HR 66 | Temp 98.5°F | Ht 62.5 in | Wt 128.5 lb

## 2012-02-02 DIAGNOSIS — C50919 Malignant neoplasm of unspecified site of unspecified female breast: Secondary | ICD-10-CM

## 2012-02-02 DIAGNOSIS — C50912 Malignant neoplasm of unspecified site of left female breast: Secondary | ICD-10-CM

## 2012-02-02 DIAGNOSIS — Z170 Estrogen receptor positive status [ER+]: Secondary | ICD-10-CM

## 2012-02-02 DIAGNOSIS — Z79810 Long term (current) use of selective estrogen receptor modulators (SERMs): Secondary | ICD-10-CM

## 2012-02-02 MED ORDER — LETROZOLE 2.5 MG PO TABS: 2.5000 mg | ORAL_TABLET | Freq: Every day | ORAL | Status: DC

## 2012-02-02 MED ORDER — GABAPENTIN 300 MG PO CAPS: 300.0000 mg | ORAL_CAPSULE | Freq: Every day | ORAL | Status: DC

## 2012-02-02 NOTE — Progress Notes (Signed)
ID: Dana Moran   DOB: Oct 13, 1961  MR#: [id number]  VHQ#:[id number]  HISTORY OF PRESENT ILLNESS: Dana Moran usually gets yearly mammography but 201, it just "went by her", so her last mammogram had been October of 2009.  In August 2011, she called to set up screening mammography and this was performed July 30, 2010.  The screening mammogram showed some calcifications in the left breast and she was recalled for additional views which were performed August 12, 2010.  There was an area of 2.2 cm with linear calcifications in a ductal distribution, suspicious for DCIS and stereotactic biopsy was performed the same day.  The pathology [id number]) showed high-grade ductal carcinoma in situ with some areas suspicious for invasion.  The in situ tumor was strongly ER and PR positive at 82 and 75% respectively.    With this information, the patient was referred to Dr. Johna Sheriff, and underwent bilateral breast MRIs on October 16th.  This showed a 7 mm enhancing nodule in the outer left breast with no evidence of abnormal or enlarged lymph nodes.  The patient was recalled for ultrasonography October 20th but although the ultrasound showed several simple cysts, there was no sonographic correlate for the mass seen on MRI. Accordingly, the patient had an MRI-guided biopsy on October 25th of the left breast mass that appeared more nodular and this showed invasive ductal carcinoma ([id number]).    With this information, the patient was brought back on October 27th and underwent left axillary sentinel lymph node biopsy and needle localized left breast lumpectomy under Dr. Johna Sheriff.  The final pathology from this procedure ([id number]) showed invasive ductal carcinoma, Grade 2, spanning 7 mm in the setting of ductal carcinoma in situ, high grade. Margins were negative, the closest being 6 mm for either component.  There was no evidence of lymphovascular invasion.  There was a single sentinel lymph node and it had  isolated tumor cells identified by cytokeratin immunohistochemistry only.  The invasive tumor was ER positive at 91%, PR positive at 100%, with a borderline MIB-1 at 19% and was Her-2 negative with a ratio by CISH of 0.97.   The patient accordingly stages as T1b N0 (i+).    INTERVAL HISTORY: Dana Moran returns for routine followup of her breast cancer. The interval history is unremarkable. She continues to work at the school, walks at least a mile daily, and sometimes does yoga in addition.   REVIEW OF SYSTEMS: She is tolerating the letrozole well, with mild hot flashes, greatly helped by the gabapentin, and some feeling of achiness particularly in the mornings, improved during the day. She had a left rotator care which is being helped by physical therapy. She is hoping to avoid surgery. Otherwise a detailed review of systems was noncontributory.    PAST MEDICAL HISTORY: Past Medical History  Diagnosis Date  . Cancer 08/13/10    brast  . Hypertension   Significant for a basal cell carcinoma removed remotely by Dr. Donzetta Starch.  History of renal cysts which are being followed.  History of right groin hernia repair at age 28.   PAST SURGICAL HISTORY: Past Surgical History  Procedure Date  . Hernia repair age 68  . Breast surgery 08/28/2010    Lt br lumpectomy    FAMILY HISTORY The patient's father is alive at age 68.  He has a history of prostate cancer.  The patient's mother is alive at age 71.  She has a history of polycystic kidney disease.  The patient has one sister,  age 54.  There is no history of breast cancer in the family but both the patient's grandmothers were diagnosed with ovarian cancer, one in her sixties and one in her seventies.   GYNECOLOGIC HISTORY: The patient is GX P3, first pregnancy to term at age 41.  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  Her husband Annette Stable, present today, works in Airline pilot for [employer] for [employer]  They have a 11- year-old daughter and 37 year-old twins, a boy and a girl. The patient is not a church attender.     ADVANCED DIRECTIVES: in place  HEALTH MAINTENANCE: History  Substance Use Topics  . Smoking status: Former Games developer  . Smokeless tobacco: Former Neurosurgeon    Quit date: 01/15/1972  . Alcohol Use: Yes     1 glasse wine a day     Colonoscopy:  PAP:  Bone density:  Lipid panel:  No Known Allergies  Current Outpatient Prescriptions  Medication Sig Dispense Refill  . Cyanocobalamin (B-12) 1000 MCG CAPS Take by mouth.        . gabapentin (NEURONTIN) 300 MG capsule Take 1 capsule (300 mg total) by mouth at bedtime.  90 capsule  4  . letrozole (FEMARA) 2.5 MG tablet Take 1 tablet (2.5 mg total) by mouth daily.  90 tablet  4  . losartan (COZAAR) 50 MG tablet Take 50 mg by mouth daily.      . Multiple Vitamin (MULTIVITAMIN PO) Take by mouth daily.        . nitroGLYCERIN (NITRODUR - DOSED IN MG/24 HR) 0.2 mg/hr Apply 1/2 of patch to left shoulder daily.  Change patch ever 24 hours.  30 patch  1  . DISCONTD: gabapentin (NEURONTIN) 300 MG capsule Take 300 mg by mouth At bedtime.        OBJECTIVE: Middle-aged white woman who appears healthy Filed Vitals:   02/02/12 1217  BP: 153/91  Pulse: 66  Temp: 98.5 F (36.9 C)     Body mass index is 23.13 kg/(m^2).    ECOG FS: 0  Sclerae unicteric Oropharynx clear No peripheral adenopathy Lungs no rales or rhonchi Heart regular rate and rhythm Abd benign MSK no focal spinal tenderness, no peripheral edema Neuro: nonfocal Breasts: Right breast no suspicious findings, left breast no evidence of local recurrence  LAB RESULTS: Lab Results  Component Value Date   WBC 4.2 04/09/2011   NEUTROABS 2.5 04/09/2011   HGB 12.8 04/09/2011   HCT 37.1 04/09/2011   MCV 89.7 04/09/2011   PLT 281 04/09/2011      Chemistry        Component Value Date/Time   NA 141 01/12/2012 1318   K 3.9 01/12/2012 1318   CL 106 01/12/2012 1318   CO2 21 01/12/2012 1318   BUN 13 01/12/2012 1318   CREATININE 0.76 01/12/2012 1318      Component Value Date/Time   CALCIUM 10.5 01/12/2012 1318   ALKPHOS 49 01/12/2012 1318   AST 15 01/12/2012 1318   ALT 14 01/12/2012 1318   BILITOT 0.4 01/12/2012 1318       Lab Results  Component Value Date   LABCA2 12 01/12/2012    No components found with this basename: [id number]    No results found for this basename: INR:1;PROTIME:1 in the last 168 hours  Urinalysis No  results found for this basename: colorurine, appearanceur, labspec, phurine, glucoseu, hgbur, bilirubinur, ketonesur, proteinur, urobilinogen, nitrite, leukocytesur    STUDIES: No results found.  ASSESSMENT: 51 year old BRCA 1-2 negative [place] woman status post left lumpectomy October 2011 for a T1b N0 (i+) invasive ductal carcinoma, grade 2, strongly ER, PR-positive, HER-2/neu-negative with MIB-1 of 19%.   (a) s/p radiation therapy completed in mid January 2012,  (b) on tamoxifen January to late April 2012   (c) s/p bilateral salpingo-oophorectomy May 2012 under the care of Dr. Seymour Bars  (d) on letrozole as of June with good tolerance.   PLAN: We discussed the option of continuing tamoxifen for 10 years versus continuing the letrozole for a total of 2 years and then switching back to tamoxifen. At this point she would like to continue on the letrozole for at least 2 years and then reassess. This is very reasonable. We're continuing the letrozole and gabapentin as before. She will see me again in October of this year. That will be her 2 year mark. Thereafter we will start followup on a once a year basis.  I am not sure why her nonfasting blood sugar was 200. I asked her to make sure to bring this up with Dr. Waynard Edwards at her next visit with him   Diasha Castleman C    02/02/2012

## 2012-02-02 NOTE — Telephone Encounter (Signed)
gave patient appointment for 08-01-2012 labs only 08-08-2012 with dr. Darnelle Catalan printed out calendar and to the patient

## 2012-07-21 ENCOUNTER — Ambulatory Visit (INDEPENDENT_AMBULATORY_CARE_PROVIDER_SITE_OTHER): Payer: 59 | Admitting: General Surgery

## 2012-07-21 ENCOUNTER — Encounter (INDEPENDENT_AMBULATORY_CARE_PROVIDER_SITE_OTHER): Payer: Self-pay | Admitting: General Surgery

## 2012-07-21 VITALS — BP 110/76 | HR 78 | Temp 97.6°F | Resp 16 | Ht 63.0 in | Wt 126.2 lb

## 2012-07-21 DIAGNOSIS — C50912 Malignant neoplasm of unspecified site of left female breast: Secondary | ICD-10-CM

## 2012-07-21 DIAGNOSIS — C50919 Malignant neoplasm of unspecified site of unspecified female breast: Secondary | ICD-10-CM

## 2012-07-21 NOTE — Progress Notes (Signed)
Chief complaint: Followup breast cancer   History: The patient returns for long-term followup status post left breast lumpectomy, sentinel lymph node biopsy, radiation and now on adjuvant letrozole for T1 BN0(isolated tumor cells) ER/PR positive cancer of the left breast. Patient has a family history of ovarian cancer and underwent bilateral salpingo-oophorectomy. She reports no problems relation to her breast. No lumps or skin changes or nipple discharge. She has no arm swelling. She had therapy earlier this year for a frozen left shoulder which is much better. She also had biopsy last fall of calcifications at her lumpectomy site which were benign. She feels the slight thickening at her lumpectomy site is actually better.  Exam: BP 110/76  Pulse 78  Temp 97.6 F (36.4 C) (Temporal)  Resp 16  Ht 5\' 3"  (1.6 m)  Wt 126 lb 3.2 oz (57.244 kg)  BMI 22.36 kg/m2 General: Well-appearing female Lymph nodes: No cervical, supraclavicular, or axillary nodes palpable Breasts: Again noted is mild thickening at the lumpectomy site in the lateral left breast. No skin changes or nipple inversion. No other palpable abnormalities in either breast.  Imaging: Bilateral breast MRI November 2012 was negative.  Mammogram is due this fall.  Assessment and plan: Doing well with no evidence of recurrent cancer on followup. Return in 6 months.

## 2012-08-01 ENCOUNTER — Other Ambulatory Visit (HOSPITAL_BASED_OUTPATIENT_CLINIC_OR_DEPARTMENT_OTHER): Payer: 59

## 2012-08-01 DIAGNOSIS — C50919 Malignant neoplasm of unspecified site of unspecified female breast: Secondary | ICD-10-CM

## 2012-08-01 DIAGNOSIS — C50912 Malignant neoplasm of unspecified site of left female breast: Secondary | ICD-10-CM

## 2012-08-01 LAB — COMPREHENSIVE METABOLIC PANEL (CC13)
ALT: 18 U/L (ref 0–55)
AST: 18 U/L (ref 5–34)
Albumin: 4.2 g/dL (ref 3.5–5.0)
CO2: 26 mEq/L (ref 22–29)
Calcium: 10 mg/dL (ref 8.4–10.4)
Chloride: 105 mEq/L (ref 98–107)
Potassium: 3.8 mEq/L (ref 3.5–5.1)

## 2012-08-01 LAB — CBC WITH DIFFERENTIAL/PLATELET
BASO%: 0.8 % (ref 0.0–2.0)
Basophils Absolute: 0 10*3/uL (ref 0.0–0.1)
EOS%: 1.4 % (ref 0.0–7.0)
HGB: 13.6 g/dL (ref 11.6–15.9)
MCH: 30.9 pg (ref 25.1–34.0)
MONO#: 0.3 10*3/uL (ref 0.1–0.9)
RDW: 12.9 % (ref 11.2–14.5)
WBC: 4.6 10*3/uL (ref 3.9–10.3)
lymph#: 1.7 10*3/uL (ref 0.9–3.3)

## 2012-08-08 ENCOUNTER — Ambulatory Visit (HOSPITAL_BASED_OUTPATIENT_CLINIC_OR_DEPARTMENT_OTHER): Payer: 59 | Admitting: Oncology

## 2012-08-08 ENCOUNTER — Other Ambulatory Visit: Payer: Self-pay | Admitting: Oncology

## 2012-08-08 ENCOUNTER — Telehealth: Payer: Self-pay | Admitting: Oncology

## 2012-08-08 VITALS — BP 174/88 | HR 54 | Temp 98.7°F | Resp 20 | Ht 63.0 in | Wt 128.5 lb

## 2012-08-08 DIAGNOSIS — C50912 Malignant neoplasm of unspecified site of left female breast: Secondary | ICD-10-CM

## 2012-08-08 DIAGNOSIS — C773 Secondary and unspecified malignant neoplasm of axilla and upper limb lymph nodes: Secondary | ICD-10-CM

## 2012-08-08 DIAGNOSIS — Z9889 Other specified postprocedural states: Secondary | ICD-10-CM

## 2012-08-08 DIAGNOSIS — Z853 Personal history of malignant neoplasm of breast: Secondary | ICD-10-CM

## 2012-08-08 DIAGNOSIS — C50419 Malignant neoplasm of upper-outer quadrant of unspecified female breast: Secondary | ICD-10-CM

## 2012-08-08 DIAGNOSIS — Z170 Estrogen receptor positive status [ER+]: Secondary | ICD-10-CM

## 2012-08-08 MED ORDER — TAMOXIFEN CITRATE 20 MG PO TABS: 20.0000 mg | ORAL_TABLET | Freq: Every day | ORAL | Status: DC

## 2012-08-08 NOTE — Progress Notes (Signed)
ID: Dana Moran   DOB: 1961/10/06  MR#: [id number]  EXB#:[id number]  PCP: Ezequiel Kayser, MD GYN: Genia Del SU: Glenna Fellows MD OTHER MD: Donzetta Starch, MD   HISTORY OF PRESENT ILLNESS: Dana Moran usually gets yearly mammography but in 2010, it just "went by her", so her last mammogram had been October of 2009.  In October of 2011 she called to set up screening mammography and this was performed July 30, 2010.  The screening mammogram showed some calcifications in the left breast and she was recalled for additional views which were performed August 12, 2010.  There was an area of 2.2 cm with linear calcifications in a ductal distribution, suspicious for DCIS and stereotactic biopsy was performed the same day.  The pathology [id number]) showed high-grade ductal carcinoma in situ with some areas suspicious for invasion.  The in situ tumor was strongly ER and PR positive at 82 and 75% respectively.    With this information, the patient was referred to Dr. Johna Sheriff, and underwent bilateral breast MRIs on October 16th.  This showed a 7 mm enhancing nodule in the outer left breast with no evidence of abnormal or enlarged lymph nodes.  The patient was recalled for ultrasonography October 20th but although the ultrasound showed several simple cysts, there was no sonographic correlate for the mass seen on MRI. Accordingly, the patient had an MRI-guided biopsy on October 25th of the left breast mass that appeared more nodular and this showed invasive ductal carcinoma ([id number]).    With this information, the patient was brought back on October 27th and underwent left axillary sentinel lymph node biopsy and needle localized left breast lumpectomy under Dr. Johna Sheriff.  The final pathology from this procedure ([id number]) showed invasive ductal carcinoma, Grade 2, spanning 7 mm in the setting of ductal carcinoma in situ, high grade. Margins were negative, the closest being 6 mm for either  component.  There was no evidence of lymphovascular invasion.  There was a single sentinel lymph node and it had isolated tumor cells identified by cytokeratin immunohistochemistry only.  The invasive tumor was ER positive at 91%, PR positive at 100%, with a borderline MIB-1 at 19% and was Her-2 negative with a ratio by CISH of 0.97.   The patient accordingly stages as T1b N0 (i+). Her subsequent history is as detailed below.  INTERVAL HISTORY: Dana Moran returns today for followup of her breast cancer. Since her last visit here she has left her work but [employer], and is now working for a Retail buyer firm. She likes her new job quite a bit. She is exercising regularly chiefly by walking and doing some yoga. Family is doing fine.  REVIEW OF SYSTEMS: She is having significant vaginal dryness problems. She's also having more hot flashes than before. She has some joint pain involving ankles wrists and shoulders. She tells that particularly in the morning when she gets out of bed her ankles are extremely stiff. They improve during the day, but any time she sits for any length of time they seem to "freeze up". Aside from this a detailed review of systems was entirely noncontributory.  PAST MEDICAL HISTORY: Past Medical History  Diagnosis Date  . Cancer 08/13/10    brast  . Hypertension   Significant for a basal cell carcinoma removed remotely by Dr. Donzetta Starch.  History of renal cysts which are being followed.  History of right groin hernia repair at age 60.   PAST SURGICAL HISTORY: Past Surgical History  Procedure Date  .  Hernia repair age 61  . Breast surgery 08/28/2010    Lt br lumpectomy  s/p BSO  FAMILY HISTORY No family history on file. The patient's father is alive at age 79.  He has a history of prostate cancer.  The patient's mother is alive at age 3.  She has a history of polycystic kidney disease.  The patient has one sister, age 35.  There is no history of breast cancer in  the family but both the patient's grandmothers were diagnosed with ovarian cancer, one in her sixties and one in her seventies.   GYNECOLOGIC HISTORY: The patient is GX P3, first pregnancy to term at age 86.  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 Her husband, Annette Stable, works in Airline pilot for [employer] for [employer]  They have a 69- year-old daughter who is a sophomore at [employer] and 50 year-old twins, a boy and a girl. The patient is not a church attender.     ADVANCED DIRECTIVES: in place  HEALTH MAINTENANCE: History  Substance Use Topics  . Smoking status: Former Games developer  . Smokeless tobacco: Former Neurosurgeon    Quit date: 01/15/1972  . Alcohol Use: Yes     1 glasse wine a day     Colonoscopy:  PAP:  Bone density: June 2012, normal  Lipid panel:  No Known Allergies  Current Outpatient Prescriptions  Medication Sig Dispense Refill  . Cyanocobalamin (B-12) 1000 MCG CAPS Take by mouth.        . gabapentin (NEURONTIN) 300 MG capsule Take 1 capsule (300 mg total) by mouth at bedtime.  90 capsule  4  . letrozole (FEMARA) 2.5 MG tablet Take 1 tablet (2.5 mg total) by mouth daily.  90 tablet  4  . losartan (COZAAR) 50 MG tablet Take 50 mg by mouth daily.      . Multiple Vitamin (MULTIVITAMIN PO) Take by mouth daily.        . nitroGLYCERIN (NITRODUR - DOSED IN MG/24 HR) 0.2 mg/hr Apply 1/2 of patch to left shoulder daily.  Change patch ever 24 hours.  30 patch  1    OBJECTIVE: Middle-aged white woman who appears well Filed Vitals:   08/08/12 1206  BP: 174/88  Pulse: 54  Temp: 98.7 F (37.1 C)  Resp: 20     Body mass index is 22.76 kg/(m^2).    ECOG FS: 0  Sclerae unicteric Oropharynx clear No cervical or supraclavicular adenopathy Lungs no rales or rhonchi Heart regular rate and rhythm Abd benign MSK no focal spinal tenderness, no peripheral edema Neuro: nonfocal Breasts: The right breast is unremarkable. The left breast is  status post lumpectomy and radiation. There is no evidence of local recurrence. The left axilla is benign.  LAB RESULTS: Lab Results  Component Value Date   WBC 4.6 08/01/2012   NEUTROABS 2.5 08/01/2012   HGB 13.6 08/01/2012   HCT 39.8 08/01/2012   MCV 90.3 08/01/2012   PLT 307 08/01/2012      Chemistry      Component Value Date/Time   NA 141 08/01/2012 1222   NA 141 01/12/2012 1318   K 3.8 08/01/2012 1222   K 3.9 01/12/2012 1318   CL 105 08/01/2012 1222   CL 106 01/12/2012 1318   CO2 26 08/01/2012 1222   CO2 21 01/12/2012 1318   BUN 13.0 08/01/2012 1222   BUN 13 01/12/2012 1318   CREATININE 0.8 08/01/2012 1222   CREATININE  0.76 01/12/2012 1318      Component Value Date/Time   CALCIUM 10.0 08/01/2012 1222   CALCIUM 10.5 01/12/2012 1318   ALKPHOS 61 08/01/2012 1222   ALKPHOS 49 01/12/2012 1318   AST 18 08/01/2012 1222   AST 15 01/12/2012 1318   ALT 18 08/01/2012 1222   ALT 14 01/12/2012 1318   BILITOT 0.40 08/01/2012 1222   BILITOT 0.4 01/12/2012 1318       Lab Results  Component Value Date   LABCA2 12 01/12/2012    No components found with this basename: [id number]    No results found for this basename: INR:1;PROTIME:1 in the last 168 hours  Urinalysis No results found for this basename: colorurine, appearanceur, labspec, phurine, glucoseu, hgbur, bilirubinur, ketonesur, proteinur, urobilinogen, nitrite, leukocytesur    STUDIES: No results found.  ASSESSMENT: 51 y.o. [place] woman known to be BRCA1 and BRCA 2-negative, status post left lumpectomy in October 2011 for a pT1b pN0(i+), Stage IA  invasive ductal carcinoma, grade 2, strongly estrogen and progesterone receptor positive, HER-2/neu-negative, with an MIB-1 of 19%.    (1) Received adjuvant radiation therapy completed in mid January 2012  (2) on tamoxifen January to April 2012  (3) s/p bilateral salpingo-oophorectomy May 2012  (4) on letrozole June 2012 to October 2013.   PLAN: I think it would be a good idea for her  to go back to tamoxifen. There is now data that tamoxifen taken for 10 years is superior to tamoxifen for 5 years. The degree of benefit it is similar to that of using aromatase inhibitors. The advantage of tamoxifen of course is that she would be able to use Vagifem suppositories or other local estrogen preparation for vaginal dryness. It could be that she is having some of the arthralgias myalgias associated with the aromatase inhibitors, and if so that will improve as well. Unfortunately the hot flashes are not likely to get much better.  The plan for her then is to go off the letrozole now, start tamoxifen November 02, 2012 after a washout period, and return to see [place] in April of next year. If she is tolerating the tamoxifen well the plan will be to continue that most likely for 5 years, and then go on to 10 years or switch to 5 years of an aromatase inhibitor  She is a ready scheduled for mammography later this month. She is due for colonoscopy sometime within the next year. She knows to call for any problems that may develop before her next visit here.   Ellionna Buckbee C    08/08/2012

## 2012-08-08 NOTE — Telephone Encounter (Signed)
gve the pt her April 2014 appt calendar °

## 2012-08-26 ENCOUNTER — Ambulatory Visit
Admission: RE | Admit: 2012-08-26 | Discharge: 2012-08-26 | Disposition: A | Discharge status: Home / Self Care | Payer: 59 | Source: Ambulatory Visit | Attending: Oncology | Admitting: Oncology

## 2012-08-26 DIAGNOSIS — Z9889 Other specified postprocedural states: Secondary | ICD-10-CM

## 2012-08-26 DIAGNOSIS — Z853 Personal history of malignant neoplasm of breast: Secondary | ICD-10-CM

## 2012-09-15 ENCOUNTER — Other Ambulatory Visit: Payer: Self-pay | Admitting: Internal Medicine

## 2012-09-15 DIAGNOSIS — Q613 Polycystic kidney, unspecified: Secondary | ICD-10-CM

## 2012-09-19 ENCOUNTER — Ambulatory Visit
Admission: RE | Admit: 2012-09-19 | Discharge: 2012-09-19 | Disposition: A | Discharge status: Home / Self Care | Payer: 59 | Source: Ambulatory Visit | Attending: Internal Medicine | Admitting: Internal Medicine

## 2012-09-19 DIAGNOSIS — Q613 Polycystic kidney, unspecified: Secondary | ICD-10-CM

## 2012-12-06 ENCOUNTER — Encounter (INDEPENDENT_AMBULATORY_CARE_PROVIDER_SITE_OTHER): Payer: Self-pay | Admitting: General Surgery

## 2013-01-20 ENCOUNTER — Ambulatory Visit (INDEPENDENT_AMBULATORY_CARE_PROVIDER_SITE_OTHER): Payer: 59 | Admitting: General Surgery

## 2013-01-20 ENCOUNTER — Encounter (INDEPENDENT_AMBULATORY_CARE_PROVIDER_SITE_OTHER): Payer: Self-pay | Admitting: General Surgery

## 2013-01-20 VITALS — BP 110/72 | HR 73 | Temp 98.8°F | Resp 18 | Ht 62.5 in | Wt 124.6 lb

## 2013-01-20 DIAGNOSIS — C50912 Malignant neoplasm of unspecified site of left female breast: Secondary | ICD-10-CM

## 2013-01-20 DIAGNOSIS — C50919 Malignant neoplasm of unspecified site of unspecified female breast: Secondary | ICD-10-CM

## 2013-01-20 NOTE — Progress Notes (Signed)
Chief complaint: Followup breast cancer   History: The patient returns for long-term followup status post left breast lumpectomy, sentinel lymph node biopsy, radiation and now on adjuvant letrozole for T1 BN0(isolated tumor cells) ER/PR positive cancer of the left breast. Patient has a family history of ovarian cancer and underwent bilateral salpingo-oophorectomy. She reports no problems relation to her breast. No lumps or skin changes or nipple discharge. She has no arm swelling. 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 She also had biopsy 2012 of calcifications at her lumpectomy site which were benign. She feels the slight thickening at her lumpectomy site is actually better.   Exam:  BP 110/72  Pulse 73  Temp(Src) 98.8 F (37.1 C) (Temporal)  Resp 18  Ht 5' 2.5" (1.588 m)  Wt 124 lb 9.3 oz (56.509 kg)  BMI 22.41 kg/m2  General: Well-appearing female  Lymph nodes: No cervical, supraclavicular, or axillary nodes palpable  Breasts: Again noted is mild thickening at the lumpectomy site in the lateral left breast. No skin changes or nipple inversion. No other palpable abnormalities in either breast.   Imaging: mammogram in October 2013 was negative.    Assessment and plan: Doing well with no evidence of recurrent cancer on followup. Return in 6 months

## 2013-01-31 ENCOUNTER — Other Ambulatory Visit: Payer: Self-pay | Admitting: Physician Assistant

## 2013-01-31 ENCOUNTER — Other Ambulatory Visit (HOSPITAL_BASED_OUTPATIENT_CLINIC_OR_DEPARTMENT_OTHER): Payer: 59 | Admitting: Lab

## 2013-01-31 DIAGNOSIS — C50912 Malignant neoplasm of unspecified site of left female breast: Secondary | ICD-10-CM

## 2013-01-31 DIAGNOSIS — C50419 Malignant neoplasm of upper-outer quadrant of unspecified female breast: Secondary | ICD-10-CM

## 2013-01-31 LAB — CBC WITH DIFFERENTIAL/PLATELET
Basophils Absolute: 0 10*3/uL (ref 0.0–0.1)
EOS%: 2.4 % (ref 0.0–7.0)
Eosinophils Absolute: 0.1 10*3/uL (ref 0.0–0.5)
HGB: 13.1 g/dL (ref 11.6–15.9)
LYMPH%: 30.3 % (ref 14.0–49.7)
MCH: 29.6 pg (ref 25.1–34.0)
MCV: 88.9 fL (ref 79.5–101.0)
MONO%: 7.5 % (ref 0.0–14.0)
NEUT#: 3.5 10*3/uL (ref 1.5–6.5)
NEUT%: 59.2 % (ref 38.4–76.8)
Platelets: 305 10*3/uL (ref 145–400)
RDW: 12.6 % (ref 11.2–14.5)

## 2013-01-31 LAB — COMPREHENSIVE METABOLIC PANEL (CC13)
AST: 16 U/L (ref 5–34)
Albumin: 4 g/dL (ref 3.5–5.0)
Alkaline Phosphatase: 50 U/L (ref 40–150)
BUN: 10.7 mg/dL (ref 7.0–26.0)
Creatinine: 0.8 mg/dL (ref 0.6–1.1)
Glucose: 101 mg/dl — ABNORMAL HIGH (ref 70–99)
Potassium: 4.5 mEq/L (ref 3.5–5.1)
Total Bilirubin: 0.47 mg/dL (ref 0.20–1.20)

## 2013-02-01 ENCOUNTER — Encounter: Payer: Self-pay | Admitting: Oncology

## 2013-02-07 ENCOUNTER — Encounter: Payer: Self-pay | Admitting: Physician Assistant

## 2013-02-07 ENCOUNTER — Telehealth: Payer: Self-pay | Admitting: *Deleted

## 2013-02-07 ENCOUNTER — Ambulatory Visit (HOSPITAL_BASED_OUTPATIENT_CLINIC_OR_DEPARTMENT_OTHER): Payer: 59 | Admitting: Physician Assistant

## 2013-02-07 VITALS — BP 150/85 | HR 66 | Temp 98.6°F | Resp 20 | Ht 62.0 in | Wt 125.6 lb

## 2013-02-07 DIAGNOSIS — Z780 Asymptomatic menopausal state: Secondary | ICD-10-CM

## 2013-02-07 DIAGNOSIS — C50912 Malignant neoplasm of unspecified site of left female breast: Secondary | ICD-10-CM

## 2013-02-07 DIAGNOSIS — Z853 Personal history of malignant neoplasm of breast: Secondary | ICD-10-CM

## 2013-02-07 DIAGNOSIS — C50919 Malignant neoplasm of unspecified site of unspecified female breast: Secondary | ICD-10-CM

## 2013-02-07 NOTE — Telephone Encounter (Signed)
appts made and printed 

## 2013-02-07 NOTE — Progress Notes (Signed)
ID: Darlin Coco   DOB: Apr 01, 1961  MR#: [id number]  WUJ#:[id number]  PCP: Dana Kayser, MD GYN: Dana Moran SU: Dana Fellows MD OTHER MD: Dana Starch, MD   HISTORY OF PRESENT ILLNESS: Dana Moran usually gets yearly mammography but in 2010, it just "went by her", so her last mammogram had been October of 2009.  In October of 2011 she called to set up screening mammography and this was performed July 30, 2010.  The screening mammogram showed some calcifications in the left breast and she was recalled for additional views which were performed August 12, 2010.  There was an area of 2.2 cm with linear calcifications in a ductal distribution, suspicious for DCIS and stereotactic biopsy was performed the same day.  The pathology [id number]) showed high-grade ductal carcinoma in situ with some areas suspicious for invasion.  The in situ tumor was strongly ER and PR positive at 82 and 75% respectively.    With this information, the patient was referred to Dr. Johna Moran, and underwent bilateral breast MRIs on October 16th.  This showed a 7 mm enhancing nodule in the outer left breast with no evidence of abnormal or enlarged lymph nodes.  The patient was recalled for ultrasonography October 20th but although the ultrasound showed several simple cysts, there was no sonographic correlate for the mass seen on MRI. Accordingly, the patient had an MRI-guided biopsy on October 25th of the left breast mass that appeared more nodular and this showed invasive ductal carcinoma ([id number]).    With this information, the patient was brought back on October 27th and underwent left axillary sentinel lymph node biopsy and needle localized left breast lumpectomy under Dr. Johna Moran.  The final pathology from this procedure ([id number]) showed invasive ductal carcinoma, Grade 2, spanning 7 mm in the setting of ductal carcinoma in situ, high grade. Margins were negative, the closest being 6 mm for either  component.  There was no evidence of lymphovascular invasion.  There was a single sentinel lymph node and it had isolated tumor cells identified by cytokeratin immunohistochemistry only.  The invasive tumor was ER positive at 91%, PR positive at 100%, with a borderline MIB-1 at 19% and was Her-2 negative with a ratio by CISH of 0.97.   The patient accordingly stages as T1b N0 (i+). Her subsequent history is as detailed below.  INTERVAL HISTORY: Dana Moran returns today for followup of her left breast cancer. She is feeling well, with few complaints today. 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 She's exercising regularly and also does yoga.  At her last appointment, Dana Moran and Dr. Darnelle Moran had discussed switching from letrozole to tamoxifen. Dana Moran decided not to make that change, however, and continues on letrozole 2.5 mg daily. She does have joint pain which has not worsened. It is worse in the morning, and improves somewhat throughout the day. She tells me, however, that it is "tolerable", and she simply did not want to make another change in her medications. She has occasional hot flashes, and gets some relief from gabapentin at night. She denies any significant vaginal dryness. She's had no vaginal bleeding.   REVIEW OF SYSTEMS: Dana Moran has had no recent illnesses and denies fevers or chills. She's had no skin changes and denies abnormal bruising or bleeding. Her energy level is great. Her appetite is good and she's had no nausea or change in bowel or bladder habits. She denies any cough, shortness of breath, or chest pain.  She's had no abnormal headaches or dizziness. Other than the arthralgias noted above, she denies any additional pain, specifically no myalgias or bony pain. She's had no peripheral swelling.  A detailed review of systems is otherwise noncontributory.  PAST MEDICAL HISTORY: Past Medical History  Diagnosis Date  . Cancer  08/13/10    brast  . Hypertension   Significant for a basal cell carcinoma removed remotely by Dr. Donzetta Moran.  History of renal cysts which are being followed.  History of right groin hernia repair at age 88.   PAST SURGICAL HISTORY: Past Surgical History  Procedure Laterality Date  . Hernia repair  age 36  . Breast surgery  08/28/2010    Lt br lumpectomy  s/p BSO  FAMILY HISTORY No family history on file. The patient's father is alive at age 40.  He has a history of prostate cancer.  The patient's mother is alive at age 76.  She has a history of polycystic kidney disease.  The patient has one sister, age 70.  There is no history of breast cancer in the family but both the patient's grandmothers were diagnosed with ovarian cancer, one in her sixties and one in her seventies.   GYNECOLOGIC HISTORY: The patient is GX P3, first pregnancy to term at age 85.  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 Her husband, Dana Moran, works in Airline pilot for [employer] for [employer]  They have a 20- year-old daughter who is a sophomore at [employer] and 51 year-old twins, a boy and a girl. The patient is not a church attender.     ADVANCED DIRECTIVES: in place  HEALTH MAINTENANCE: History  Substance Use Topics  . Smoking status: Former Games developer  . Smokeless tobacco: Former Neurosurgeon    Quit date: 01/15/1972  . Alcohol Use: Yes     Comment: 1 glasse wine a day     Colonoscopy:  Feb 2014, Dana Moran  PAP:  UTD, Dr. Seymour Moran  Bone density: June 2012, normal  Lipid panel:  No Known Allergies  Current Outpatient Prescriptions  Medication Sig Dispense Refill  . Calcium Carbonate-Vitamin D (CALCIUM-VITAMIN D) 500-200 MG-UNIT per tablet Take 1 tablet by mouth 2 (two) times daily with a meal.      . Cyanocobalamin (B-12) 1000 MCG CAPS Take by mouth.        . gabapentin (NEURONTIN) 300 MG capsule Take 1 capsule (300 mg total) by mouth at bedtime.  90 capsule  4  .  letrozole (FEMARA) 2.5 MG tablet Take 1 tablet (2.5 mg total) by mouth daily.  90 tablet  4  . Multiple Vitamin (MULTIVITAMIN PO) Take by mouth daily.         No current facility-administered medications for this visit.    OBJECTIVE: Middle-aged white woman who appears well Filed Vitals:   02/07/13 0943  BP: 150/85  Pulse: 66  Temp: 98.6 F (37 C)  Resp: 20     Body mass index is 22.97 kg/(m^2).    ECOG FS: 0 Filed Weights   02/07/13 0943  Weight: 125 lb 9.6 oz (56.972 kg)   Sclerae unicteric Oropharynx clear No cervical or supraclavicular adenopathy Lungs clear to auscultation bilaterally, no rales or rhonchi Heart regular rate and rhythm Abdomen soft, nontender, positive bowel sounds. MSK no focal spinal tenderness, no peripheral edema Neuro: nonfocal, well oriented with positive affect Breasts: The right breast is unremarkable. The left breast is status post lumpectomy and  radiation. There is no evidence of local recurrence. Axillae are benign bilaterally with no palpable adenopathy.  LAB RESULTS: Lab Results  Component Value Date   WBC 5.9 01/31/2013   NEUTROABS 3.5 01/31/2013   HGB 13.1 01/31/2013   HCT 39.5 01/31/2013   MCV 88.9 01/31/2013   PLT 305 01/31/2013      Chemistry      Component Value Date/Time   NA 143 01/31/2013 0919   NA 141 01/12/2012 1318   K 4.5 01/31/2013 0919   K 3.9 01/12/2012 1318   CL 108* 01/31/2013 0919   CL 106 01/12/2012 1318   CO2 26 01/31/2013 0919   CO2 21 01/12/2012 1318   BUN 10.7 01/31/2013 0919   BUN 13 01/12/2012 1318   CREATININE 0.8 01/31/2013 0919   CREATININE 0.76 01/12/2012 1318      Component Value Date/Time   CALCIUM 9.9 01/31/2013 0919   CALCIUM 10.5 01/12/2012 1318   ALKPHOS 50 01/31/2013 0919   ALKPHOS 49 01/12/2012 1318   AST 16 01/31/2013 0919   AST 15 01/12/2012 1318   ALT 14 01/31/2013 0919   ALT 14 01/12/2012 1318   BILITOT 0.47 01/31/2013 0919   BILITOT 0.4 01/12/2012 1318       Lab Results  Component Value Date   LABCA2 12 01/12/2012       STUDIES: Most recent bilateral mammogram on 08/26/2012 was unremarkable.    ASSESSMENT: 52 y.o. [place] woman known to be BRCA1 and BRCA 2-negative, status post left lumpectomy in October 2011 for a pT1b pN0(i+), Stage IA  invasive ductal carcinoma, grade 2, strongly estrogen and progesterone receptor positive, HER-2/neu-negative, with an MIB-1 of 19%.    (1) Received adjuvant radiation therapy completed in mid January 2012  (2) on tamoxifen January to April 2012  (3) s/p bilateral salpingo-oophorectomy May 2012  (4) on letrozole since June 2012.   PLAN: With regards to her breast cancer, Dana Moran is doing extremely well. She will continue on her letrozole as before. She'll have her next mammogram in late October, and we'll see Dr. Darnelle Moran for followup in early November of this year.  She voices understanding and agreement with this plan, and knows to call with any changes or problems.   Dana Moran    02/07/2013

## 2013-02-08 ENCOUNTER — Telehealth: Payer: Self-pay | Admitting: Oncology

## 2013-02-08 NOTE — Telephone Encounter (Signed)
Bone density appt cx'd. Per pt she has a bone density a couple of months ago through Dr. Laurey Morale office and will have result send to [place]. AB informed. Pt already has other appts. Order for bone density was an add on.

## 2013-02-28 ENCOUNTER — Other Ambulatory Visit: Payer: Self-pay | Admitting: *Deleted

## 2013-02-28 DIAGNOSIS — C50919 Malignant neoplasm of unspecified site of unspecified female breast: Secondary | ICD-10-CM

## 2013-02-28 MED ORDER — LETROZOLE 2.5 MG PO TABS: 2.5000 mg | ORAL_TABLET | Freq: Every day | ORAL | Status: DC

## 2013-04-07 ENCOUNTER — Other Ambulatory Visit: Payer: Self-pay | Admitting: *Deleted

## 2013-04-07 DIAGNOSIS — C50912 Malignant neoplasm of unspecified site of left female breast: Secondary | ICD-10-CM

## 2013-04-07 MED ORDER — GABAPENTIN 300 MG PO CAPS: 300.0000 mg | ORAL_CAPSULE | Freq: Every day | ORAL | Status: DC

## 2013-08-28 ENCOUNTER — Other Ambulatory Visit: Payer: 59

## 2013-08-29 ENCOUNTER — Other Ambulatory Visit (HOSPITAL_BASED_OUTPATIENT_CLINIC_OR_DEPARTMENT_OTHER): Payer: 59 | Admitting: Lab

## 2013-08-29 ENCOUNTER — Encounter (INDEPENDENT_AMBULATORY_CARE_PROVIDER_SITE_OTHER): Payer: Self-pay

## 2013-08-29 DIAGNOSIS — C50419 Malignant neoplasm of upper-outer quadrant of unspecified female breast: Secondary | ICD-10-CM

## 2013-08-29 DIAGNOSIS — C50912 Malignant neoplasm of unspecified site of left female breast: Secondary | ICD-10-CM

## 2013-08-29 DIAGNOSIS — Z853 Personal history of malignant neoplasm of breast: Secondary | ICD-10-CM

## 2013-08-29 LAB — CBC WITH DIFFERENTIAL/PLATELET
Basophils Absolute: 0 10*3/uL (ref 0.0–0.1)
EOS%: 2.1 % (ref 0.0–7.0)
Eosinophils Absolute: 0.1 10*3/uL (ref 0.0–0.5)
HCT: 38.9 % (ref 34.8–46.6)
HGB: 13.1 g/dL (ref 11.6–15.9)
MCH: 30 pg (ref 25.1–34.0)
MCV: 89.5 fL (ref 79.5–101.0)
MONO%: 6.6 % (ref 0.0–14.0)
NEUT#: 4 10*3/uL (ref 1.5–6.5)
NEUT%: 64.7 % (ref 38.4–76.8)
Platelets: 297 10*3/uL (ref 145–400)
RDW: 13.2 % (ref 11.2–14.5)

## 2013-08-29 LAB — COMPREHENSIVE METABOLIC PANEL (CC13)
Albumin: 4 g/dL (ref 3.5–5.0)
Alkaline Phosphatase: 50 U/L (ref 40–150)
BUN: 14.2 mg/dL (ref 7.0–26.0)
Calcium: 9.9 mg/dL (ref 8.4–10.4)
Creatinine: 0.8 mg/dL (ref 0.6–1.1)
Glucose: 114 mg/dl (ref 70–140)
Potassium: 4.7 mEq/L (ref 3.5–5.1)

## 2013-08-30 ENCOUNTER — Ambulatory Visit
Admission: RE | Admit: 2013-08-30 | Discharge: 2013-08-30 | Disposition: A | Discharge status: Home / Self Care | Payer: 59 | Source: Ambulatory Visit | Attending: Physician Assistant | Admitting: Physician Assistant

## 2013-08-30 DIAGNOSIS — C50912 Malignant neoplasm of unspecified site of left female breast: Secondary | ICD-10-CM

## 2013-08-30 DIAGNOSIS — Z853 Personal history of malignant neoplasm of breast: Secondary | ICD-10-CM

## 2013-09-04 ENCOUNTER — Ambulatory Visit (HOSPITAL_BASED_OUTPATIENT_CLINIC_OR_DEPARTMENT_OTHER): Payer: 59 | Admitting: Oncology

## 2013-09-04 ENCOUNTER — Telehealth: Payer: Self-pay | Admitting: *Deleted

## 2013-09-04 VITALS — BP 158/80 | HR 72 | Temp 98.4°F | Resp 18 | Ht 62.0 in | Wt 127.1 lb

## 2013-09-04 DIAGNOSIS — C50512 Malignant neoplasm of lower-outer quadrant of left female breast: Secondary | ICD-10-CM | POA: Insufficient documentation

## 2013-09-04 DIAGNOSIS — Z170 Estrogen receptor positive status [ER+]: Secondary | ICD-10-CM

## 2013-09-04 DIAGNOSIS — C773 Secondary and unspecified malignant neoplasm of axilla and upper limb lymph nodes: Secondary | ICD-10-CM

## 2013-09-04 DIAGNOSIS — C50419 Malignant neoplasm of upper-outer quadrant of unspecified female breast: Secondary | ICD-10-CM

## 2013-09-04 NOTE — Progress Notes (Signed)
ID: Dana Moran   DOB: 1961-02-27  MR#: [id number]  MVH#:[id number]  PCP: Ezequiel Kayser, MD GYN: Genia Del SU: Glenna Fellows MD OTHER MD: Donzetta Starch, MD   HISTORY OF PRESENT ILLNESS: Dana Moran usually gets yearly mammography but in 2010, it just "went by her", so her last mammogram had been October of 2009.  In October of 2011 she called to set up screening mammography and this was performed July 30, 2010.  The screening mammogram showed some calcifications in the left breast and she was recalled for additional views which were performed August 12, 2010.  There was an area of 2.2 cm with linear calcifications in a ductal distribution, suspicious for DCIS and stereotactic biopsy was performed the same day.  The pathology [id number]) showed high-grade ductal carcinoma in situ with some areas suspicious for invasion.  The in situ tumor was strongly ER and PR positive at 82 and 75% respectively.    With this information, the patient was referred to Dr. Johna Sheriff, and underwent bilateral breast MRIs on October 16th.  This showed a 7 mm enhancing nodule in the outer left breast with no evidence of abnormal or enlarged lymph nodes.  The patient was recalled for ultrasonography October 20th but although the ultrasound showed several simple cysts, there was no sonographic correlate for the mass seen on MRI. Accordingly, the patient had an MRI-guided biopsy on October 25th of the left breast mass that appeared more nodular and this showed invasive ductal carcinoma ([id number]).    With this information, the patient was brought back on October 27th and underwent left axillary sentinel lymph node biopsy and needle localized left breast lumpectomy under Dr. Johna Sheriff.  The final pathology from this procedure ([id number]) showed invasive ductal carcinoma, Grade 2, spanning 7 mm in the setting of ductal carcinoma in situ, high grade. Margins were negative, the closest being 6 mm for either  component.  There was no evidence of lymphovascular invasion.  There was a single sentinel lymph node and it had isolated tumor cells identified by cytokeratin immunohistochemistry only.  The invasive tumor was ER positive at 91%, PR positive at 100%, with a borderline MIB-1 at 19% and was Her-2 negative with a ratio by CISH of 0.97.   The patient accordingly stages as T1b N0 (i+). Her subsequent history is as detailed below.  INTERVAL HISTORY: Dana Moran returns today for followup of her left breast cancer. The interval history is generally unremarkable. 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 She is a little bit more time to herself. She still does you have at least twice a week. She does not job or to [employer].  REVIEW OF SYSTEMS: Dana Moran is tolerating the letrozole with hot flashes which have not significantly changed. She does take the gabapentin at bedtime and that helps. Vaginal dryness is not a major issue. She had a bone density she thinks last year under Dr. Danielle Dess knee and she understands that to have been "fine". Of course she will be following up on that as appropriate. Otherwise she is concerned because for the past 2 or 3 weeks she has had increased pain in the left breast, from the axillae through the scar. These are shooting pains, there are very intermittent, and they are not associated with erythema, fever, swelling, and particularly not associated with left upper extremity swelling. A detailed review of systems today was otherwise unremarkable.  PAST MEDICAL HISTORY: Past Medical History  Diagnosis Date  . Cancer 08/13/10  brast  . Hypertension   Significant for a basal cell carcinoma removed remotely by Dr. Donzetta Starch.  History of renal cysts which are being followed.  History of right groin hernia repair at age 40.   PAST SURGICAL HISTORY: Past Surgical History  Procedure Laterality Date  . Hernia repair  age 41  . Breast surgery  08/28/2010    Lt br lumpectomy  s/p  BSO  FAMILY HISTORY No family history on file. The patient's father is alive at age 43.  He has a history of prostate cancer.  The patient's mother is alive at age 71.  She has a history of polycystic kidney disease.  The patient has one sister, age 38.  There is no history of breast cancer in the family but both the patient's grandmothers were diagnosed with ovarian cancer, one in her sixties and one in her seventies.   GYNECOLOGIC HISTORY: The patient is GX P3, first pregnancy to term at age 9.  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 Her husband, Annette Stable, works in Airline pilot for [employer] for [employer]  They have a daughter who is a sophomore at [employer] daughter who is a sophomore at [employer] and 51 year-old twins, a boy and a girl. The patient is not a church attender.     ADVANCED DIRECTIVES: in place  HEALTH MAINTENANCE: History  Substance Use Topics  . Smoking status: Former Games developer  . Smokeless tobacco: Former Neurosurgeon    Quit date: 01/15/1972  . Alcohol Use: Yes     Comment: 1 glasse wine a day     Colonoscopy:  Feb 2014, Dr. Loreta Ave  PAP:  UTD, Dr. Seymour Bars  Bone density: June 2012, normal  Lipid panel:  No Known Allergies  Current Outpatient Prescriptions  Medication Sig Dispense Refill  . Calcium Carbonate-Vitamin D (CALCIUM-VITAMIN D) 500-200 MG-UNIT per tablet Take 1 tablet by mouth 2 (two) times daily with a meal.      . Cyanocobalamin (B-12) 1000 MCG CAPS Take by mouth.        . gabapentin (NEURONTIN) 300 MG capsule Take 1 capsule (300 mg total) by mouth at bedtime.  31 capsule  5  . letrozole (FEMARA) 2.5 MG tablet Take 1 tablet (2.5 mg total) by mouth daily.  90 tablet  2  . Multiple Vitamin (MULTIVITAMIN PO) Take by mouth daily.         No current facility-administered medications for this visit.    OBJECTIVE: Middle-aged white woman in no acute distress Filed Vitals:   09/04/13 0935  BP: 158/80  Pulse: 72  Temp:  98.4 F (36.9 C)  Resp: 18     Body mass index is 23.24 kg/(m^2).    ECOG FS: 0 Filed Weights   09/04/13 0935  Weight: 127 lb 1.6 oz (57.652 kg)   Sclerae unicteric Oropharynx clear No cervical or supraclavicular adenopathy Lungs clear to auscultation bilaterally, no rales or rhonchi Heart regular rate and rhythm Abdomen soft, nontender, positive bowel sounds. MSK no focal spinal tenderness, no peripheral edema Neuro: nonfocal, well oriented, positive affect Breasts: The right breast is unremarkable. The left breast is status post lumpectomy and radiation. There is no evidence of local recurrence. There is an area of focal tenderness in the upper outer quadrant, without associated palpable mass or erythema. Axillae are benign bilaterally   LAB RESULTS: Lab Results  Component Value Date   WBC 6.2 08/29/2013   NEUTROABS 4.0 08/29/2013  HGB 13.1 08/29/2013   HCT 38.9 08/29/2013   MCV 89.5 08/29/2013   PLT 297 08/29/2013      Chemistry      Component Value Date/Time   NA 144 08/29/2013 0908   NA 141 01/12/2012 1318   K 4.7 08/29/2013 0908   K 3.9 01/12/2012 1318   CL 108* 01/31/2013 0919   CL 106 01/12/2012 1318   CO2 26 08/29/2013 0908   CO2 21 01/12/2012 1318   BUN 14.2 08/29/2013 0908   BUN 13 01/12/2012 1318   CREATININE 0.8 08/29/2013 0908   CREATININE 0.76 01/12/2012 1318      Component Value Date/Time   CALCIUM 9.9 08/29/2013 0908   CALCIUM 10.5 01/12/2012 1318   ALKPHOS 50 08/29/2013 0908   ALKPHOS 49 01/12/2012 1318   AST 17 08/29/2013 0908   AST 15 01/12/2012 1318   ALT 16 08/29/2013 0908   ALT 14 01/12/2012 1318   BILITOT 0.55 08/29/2013 0908   BILITOT 0.4 01/12/2012 1318       Lab Results  Component Value Date   LABCA2 12 01/12/2012      STUDIES: Mm Digital Diagnostic Bilat  08/30/2013   CLINICAL DATA:  History of left lumpectomy in with radiation treatment in 2011.  EXAM: DIGITAL DIAGNOSTIC  BILATERAL MAMMOGRAM WITH CAD  COMPARISON:  08/26/2012 and  earlier  ACR Breast Density Category c: The breasts are heterogeneously dense, which may obscure small masses.  FINDINGS: No suspicious mass, distortion, or microcalcifications are identified to suggest presence of malignancy. Postoperative changes are identified in the upper-outer quadrant of the left breast. Spot tangential view of the lumpectomy site shows numerous benign-appearing calcifications, similar in appearance to prior studies. Previous benign stereotactic guided core biopsy was performed of this region in 2012.  Mammographic images were processed with CAD.  IMPRESSION: 1.  No mammographic evidence for malignancy. 2. Stable changes in the lumpectomy site.  RECOMMENDATION: Diagnostic mammogram is suggested in 1 year.  I have discussed the findings and recommendations with the patient. Results were also provided in writing at the conclusion of the visit. If applicable, a reminder letter will be sent to the patient regarding the next appointment.  BI-RADS CATEGORY  2: Benign Finding(s)   Electronically Signed   By: Rosalie Gums M.D.   On: 08/30/2013 10:44     ASSESSMENT: 52 y.o. [place] woman known to be BRCA1 and BRCA 2-negative, status post left lumpectomy in October 2011 for a pT1b pN0(i+), Stage IA  invasive ductal carcinoma, grade 2, strongly estrogen and progesterone receptor positive, HER-2/neu-negative, with an MIB-1 of 19%.    (1) Received adjuvant radiation therapy completed in mid January 2012  (2) on tamoxifen January to April 2012  (3) s/p bilateral salpingo-oophorectomy May 2012  (4) on letrozole since June 2012.   PLAN: Madelena is doing fine as far as her breast cancer is concerned. She should be about due to have a repeat bone density and likely she will be obtaining this through Dr. Christella Hartigan office after her next visit here. The plan is to continue letrozole 2 more years then discharged from followup here.  I think the discomfort she is feeling in the left breast upper  outer quadrant is going to be secondary to scar tissue in that area. The recent mammogram is very detailed in his description of the left upper outer quadrant and finds nothing unusual. She does have heterogeneously dense breasts, which lowers mammographic sensitivity. After discussing what to do, she is going  to let me know the first week in December with her the discomfort persists. If it does we will likely move to an left breast ultrasound or MRI.  Ellsworth Waldschmidt C    09/04/2013

## 2013-09-04 NOTE — Telephone Encounter (Signed)
appts made and printed...td 

## 2013-09-07 ENCOUNTER — Other Ambulatory Visit: Payer: Self-pay

## 2013-11-13 ENCOUNTER — Other Ambulatory Visit: Payer: Self-pay | Admitting: *Deleted

## 2013-11-13 DIAGNOSIS — C50912 Malignant neoplasm of unspecified site of left female breast: Secondary | ICD-10-CM

## 2013-11-13 MED ORDER — GABAPENTIN 300 MG PO CAPS: 300.0000 mg | ORAL_CAPSULE | Freq: Every day | ORAL | Status: DC

## 2013-12-21 ENCOUNTER — Other Ambulatory Visit: Payer: Self-pay | Admitting: *Deleted

## 2013-12-21 DIAGNOSIS — C50919 Malignant neoplasm of unspecified site of unspecified female breast: Secondary | ICD-10-CM

## 2013-12-21 MED ORDER — LETROZOLE 2.5 MG PO TABS: 2.5000 mg | ORAL_TABLET | Freq: Every day | ORAL | Status: DC

## 2014-04-27 ENCOUNTER — Telehealth: Payer: Self-pay | Admitting: *Deleted

## 2014-04-27 DIAGNOSIS — C50512 Malignant neoplasm of lower-outer quadrant of left female breast: Secondary | ICD-10-CM

## 2014-04-27 DIAGNOSIS — N63 Unspecified lump in unspecified breast: Secondary | ICD-10-CM

## 2014-04-27 NOTE — Telephone Encounter (Signed)
This RN spoke with pt per her call stating she " has new lumps in my left breast ".  Pt noticed lumps on Monday 6/22 in her LEFT breast not adjacent to lumpectomy scar.  Per discussion plan is to proceed to a LEFT breast U/s and Mammogram.  Essence verbalized understanding - order placed.

## 2014-05-03 ENCOUNTER — Ambulatory Visit
Admission: RE | Admit: 2014-05-03 | Discharge: 2014-05-03 | Disposition: A | Discharge status: Home / Self Care | Payer: 59 | Source: Ambulatory Visit | Attending: Physician Assistant | Admitting: Physician Assistant

## 2014-05-03 ENCOUNTER — Ambulatory Visit
Admission: RE | Admit: 2014-05-03 | Discharge: 2014-05-03 | Disposition: A | Discharge status: Home / Self Care | Payer: 59 | Source: Ambulatory Visit | Attending: Adult Health | Admitting: Adult Health

## 2014-05-03 ENCOUNTER — Other Ambulatory Visit: Payer: Self-pay | Admitting: Physician Assistant

## 2014-05-03 DIAGNOSIS — C50512 Malignant neoplasm of lower-outer quadrant of left female breast: Secondary | ICD-10-CM

## 2014-05-03 DIAGNOSIS — N63 Unspecified lump in unspecified breast: Secondary | ICD-10-CM

## 2014-08-14 ENCOUNTER — Other Ambulatory Visit: Payer: Self-pay | Admitting: Oncology

## 2014-08-14 DIAGNOSIS — Z853 Personal history of malignant neoplasm of breast: Secondary | ICD-10-CM

## 2014-08-14 DIAGNOSIS — Z9889 Other specified postprocedural states: Secondary | ICD-10-CM

## 2014-08-28 ENCOUNTER — Other Ambulatory Visit (HOSPITAL_BASED_OUTPATIENT_CLINIC_OR_DEPARTMENT_OTHER): Payer: 59

## 2014-08-28 DIAGNOSIS — C50512 Malignant neoplasm of lower-outer quadrant of left female breast: Secondary | ICD-10-CM

## 2014-08-28 DIAGNOSIS — C50412 Malignant neoplasm of upper-outer quadrant of left female breast: Secondary | ICD-10-CM

## 2014-08-28 LAB — CBC WITH DIFFERENTIAL/PLATELET
BASO%: 1.3 % (ref 0.0–2.0)
Basophils Absolute: 0.1 10*3/uL (ref 0.0–0.1)
EOS%: 2.8 % (ref 0.0–7.0)
Eosinophils Absolute: 0.2 10*3/uL (ref 0.0–0.5)
HEMATOCRIT: 40.7 % (ref 34.8–46.6)
HGB: 13.4 g/dL (ref 11.6–15.9)
LYMPH#: 1.7 10*3/uL (ref 0.9–3.3)
LYMPH%: 32.1 % (ref 14.0–49.7)
MCH: 29.9 pg (ref 25.1–34.0)
MCHC: 33.1 g/dL (ref 31.5–36.0)
MCV: 90.3 fL (ref 79.5–101.0)
MONO#: 0.4 10*3/uL (ref 0.1–0.9)
MONO%: 7.7 % (ref 0.0–14.0)
NEUT#: 3.1 10*3/uL (ref 1.5–6.5)
NEUT%: 56.1 % (ref 38.4–76.8)
PLATELETS: 320 10*3/uL (ref 145–400)
RBC: 4.5 10*6/uL (ref 3.70–5.45)
RDW: 12.8 % (ref 11.2–14.5)
WBC: 5.4 10*3/uL (ref 3.9–10.3)

## 2014-08-28 LAB — COMPREHENSIVE METABOLIC PANEL (CC13)
ALK PHOS: 54 U/L (ref 40–150)
ALT: 22 U/L (ref 0–55)
AST: 22 U/L (ref 5–34)
Albumin: 4.1 g/dL (ref 3.5–5.0)
Anion Gap: 10 mEq/L (ref 3–11)
BILIRUBIN TOTAL: 0.47 mg/dL (ref 0.20–1.20)
BUN: 10.9 mg/dL (ref 7.0–26.0)
CO2: 25 mEq/L (ref 22–29)
CREATININE: 0.8 mg/dL (ref 0.6–1.1)
Calcium: 9.8 mg/dL (ref 8.4–10.4)
Chloride: 108 mEq/L (ref 98–109)
Glucose: 70 mg/dl (ref 70–140)
Potassium: 4 mEq/L (ref 3.5–5.1)
Sodium: 143 mEq/L (ref 136–145)
Total Protein: 7.2 g/dL (ref 6.4–8.3)

## 2014-08-31 ENCOUNTER — Ambulatory Visit
Admission: RE | Admit: 2014-08-31 | Discharge: 2014-08-31 | Disposition: A | Discharge status: Home / Self Care | Payer: 59 | Source: Ambulatory Visit | Attending: Oncology | Admitting: Oncology

## 2014-08-31 DIAGNOSIS — Z853 Personal history of malignant neoplasm of breast: Secondary | ICD-10-CM

## 2014-08-31 DIAGNOSIS — Z9889 Other specified postprocedural states: Secondary | ICD-10-CM

## 2014-09-04 ENCOUNTER — Encounter: Payer: Self-pay | Admitting: Nurse Practitioner

## 2014-09-04 ENCOUNTER — Telehealth: Payer: Self-pay | Admitting: Oncology

## 2014-09-04 ENCOUNTER — Ambulatory Visit (HOSPITAL_BASED_OUTPATIENT_CLINIC_OR_DEPARTMENT_OTHER): Payer: 59 | Admitting: Nurse Practitioner

## 2014-09-04 VITALS — BP 154/82 | HR 63 | Temp 97.7°F | Resp 18 | Ht 62.0 in | Wt 130.0 lb

## 2014-09-04 DIAGNOSIS — C50512 Malignant neoplasm of lower-outer quadrant of left female breast: Secondary | ICD-10-CM

## 2014-09-04 DIAGNOSIS — C50412 Malignant neoplasm of upper-outer quadrant of left female breast: Secondary | ICD-10-CM

## 2014-09-04 DIAGNOSIS — R232 Flushing: Secondary | ICD-10-CM

## 2014-09-04 DIAGNOSIS — N898 Other specified noninflammatory disorders of vagina: Secondary | ICD-10-CM | POA: Insufficient documentation

## 2014-09-04 DIAGNOSIS — Z170 Estrogen receptor positive status [ER+]: Secondary | ICD-10-CM

## 2014-09-04 DIAGNOSIS — Z23 Encounter for immunization: Secondary | ICD-10-CM

## 2014-09-04 MED ORDER — VENLAFAXINE HCL ER 37.5 MG PO CP24: 37.5000 mg | ORAL_CAPSULE | Freq: Every day | ORAL | Status: DC

## 2014-09-04 MED ORDER — INFLUENZA VAC SPLIT QUAD 0.5 ML IM SUSY
0.5000 mL | PREFILLED_SYRINGE | Freq: Once | INTRAMUSCULAR | Status: AC
  Administered 2014-09-04: 0.5 mL via INTRAMUSCULAR
  Filled 2014-09-04: qty 0.5

## 2014-09-04 NOTE — Progress Notes (Addendum)
ID: Dana Moran   DOB: 1961-06-26  MR#: [id number]  XBW#:[id number]  PCP: Dana Ly, MD GYN: Dana Moran SU: Dana Seltzer MD OTHER MD: Dana Matin, MD  CHIEF COMPLAINT: left breast cancer CURRENT TREATMENT: letrozole daily  BREAST CANCER HISTORY: Dana Moran usually gets yearly mammography but in 2010, it just "went by her", so her last mammogram had been October of 2009.  In October of 2011 she called to set up screening mammography and this was performed July 30, 2010.  The screening mammogram showed some calcifications in the left breast and she was recalled for additional views which were performed August 12, 2010.  There was an area of 2.2 cm with linear calcifications in a ductal distribution, suspicious for DCIS and stereotactic biopsy was performed the same day.  The pathology [id number]) showed high-grade ductal carcinoma in situ with some areas suspicious for invasion.  The in situ tumor was strongly ER and PR positive at 82 and 75% respectively.    With this information, the patient was referred to Dr. Excell Moran, and underwent bilateral breast MRIs on October 16th.  This showed a 7 mm enhancing nodule in the outer left breast with no evidence of abnormal or enlarged lymph nodes.  The patient was recalled for ultrasonography October 20th but although the ultrasound showed several simple cysts, there was no sonographic correlate for the mass seen on MRI. Accordingly, the patient had an MRI-guided biopsy on October 25th of the left breast mass that appeared more nodular and this showed invasive ductal carcinoma ([id number]).    With this information, the patient was brought back on October 27th and underwent left axillary sentinel lymph node biopsy and needle localized left breast lumpectomy under Dr. Excell Moran.  The final pathology from this procedure ([id number]) showed invasive ductal carcinoma, Grade 2, spanning 7 mm in the setting of ductal carcinoma in situ, high  grade. Margins were negative, the closest being 6 mm for either component.  There was no evidence of lymphovascular invasion.  There was a single sentinel lymph node and it had isolated tumor cells identified by cytokeratin immunohistochemistry only.  The invasive tumor was ER positive at 91%, PR positive at 100%, with a borderline MIB-1 at 19% and was Her-2 negative with a ratio by CISH of 0.97.   The patient accordingly stages as T1b N0 (i+). Her subsequent history is as detailed below.  INTERVAL HISTORY: Dana Moran returns today for follow up of her breast cancer. She has been on Letrozole since June 2012 and is tolerating it generally well with a few complaints. The joint pain she had previously experienced has resolved itself, however she continues to experience multiple intense hot flashes throughout the day and night, despite being on gabapentin 35m QHS. She also has vaginal dryness and is treating this with water based lubrication. Otherwise, she is doing well. She walks, bikes, and participates in yoga frequently.   REVIEW OF SYSTEMS: [name] fevers, chills, nausea, vomiting, or changes in bowel or bladder habits. She has no shortness of breath, chest pain, cough, palpitations, or fatigue. She denies unexplained bleeding, weight loss, headaches, or dizziness. A detailed review of systems is otherwise noncontributory.   PAST MEDICAL HISTORY: Past Medical History  Diagnosis Date  . Cancer 08/13/10    brast  . Hypertension   Significant for a basal cell carcinoma removed remotely by Dr. DJarome Moran  History of renal cysts which are being followed.  History of right groin hernia repair at age 53   PAST  SURGICAL HISTORY: Past Surgical History  Procedure Laterality Date  . Hernia repair  age 45  . Breast surgery  08/28/2010    Lt br lumpectomy  s/p BSO  FAMILY HISTORY No family history on file. The patient's father is alive at age 37.  He has a history of prostate cancer.  The patient's  mother is alive at age 75.  She has a history of polycystic kidney disease.  The patient has one sister, age 13.  There is no history of breast cancer in the family but both the patient's grandmothers were diagnosed with ovarian cancer, one in her 73 and one in her 62.   GYNECOLOGIC HISTORY: The patient is GX P3, first pregnancy to term at age 53.  She underwent BSO May 2012  SOCIAL HISTORY: (updated November 2014) Dana Moran works in Pharmacologist. Her husband, Dana Moran, works in Press photographer for [employer] for [employer].  They have a daughter who is a sophomore at [employer] daughter who is a sophomore at [employer] and 52 year-old twins, a boy and a girl. The patient is not a church attender.     ADVANCED DIRECTIVES: in place  HEALTH MAINTENANCE: History  Substance Use Topics  . Smoking status: Former Research scientist (life sciences)  . Smokeless tobacco: Former Systems developer    Quit date: 01/15/1972  . Alcohol Use: Yes     Comment: 1 glasse wine a day     Colonoscopy:  Feb 2014, Dr. Collene Moran  PAP:  UTD, Dr. Dellis Moran  Bone density: June 2012, normal  Lipid panel:  No Known Allergies  Current Outpatient Prescriptions  Medication Sig Dispense Refill  . Calcium Carbonate-Vitamin D (CALCIUM-VITAMIN D) 500-200 MG-UNIT per tablet Take 1 tablet by mouth 2 (two) times daily with a meal.    . Cyanocobalamin (B-12) 1000 MCG CAPS Take by mouth.      . gabapentin (NEURONTIN) 300 MG capsule Take 1 capsule (300 mg total) by mouth at bedtime. 31 capsule 9  . letrozole (FEMARA) 2.5 MG tablet Take 1 tablet (2.5 mg total) by mouth daily. 31 tablet 9  . Multiple Vitamin (MULTIVITAMIN PO) Take by mouth daily.      Marland Kitchen venlafaxine XR (EFFEXOR-XR) 37.5 MG 24 hr capsule Take 1 capsule (37.5 mg total) by mouth daily with breakfast. 30 capsule 3   Current Facility-Administered Medications  Medication Dose Route Frequency Provider Last Rate Last Dose  . Influenza vac split quadrivalent PF (FLUARIX) injection 0.5 mL  0.5 mL Intramuscular Once Dana Duster, NP        OBJECTIVE: Middle-aged white woman in no acute distress Filed Vitals:   09/04/14 1002  BP: 154/82  Pulse: 63  Temp: 97.7 F (36.5 C)  Resp: 18     Body mass index is 23.77 kg/(m^2).    ECOG FS: 0 Filed Weights   09/04/14 1002  Weight: 130 lb (58.968 kg)   Skin: warm, dry  HEENT: sclerae anicteric, conjunctivae pink, oropharynx clear. No thrush or mucositis.  Lymph Nodes: No cervical or supraclavicular lymphadenopathy  Lungs: clear to auscultation bilaterally, no rales, wheezes, or rhonci  Heart: regular rate and rhythm  Abdomen: round, soft, non tender, positive bowel sounds  Musculoskeletal: No focal spinal tenderness, no peripheral edema  Neuro: non focal, well oriented, positive affect  Breasts: left breast status post lumpectomy and radiation. No skin or nipple changes. No evidence of local recurrence. Left axilla benign. Right breast unremarkable.   LAB RESULTS: Lab Results  Component Value Date   WBC 5.4  08/28/2014   NEUTROABS 3.1 08/28/2014   HGB 13.4 08/28/2014   HCT 40.7 08/28/2014   MCV 90.3 08/28/2014   PLT 320 08/28/2014      Chemistry      Component Value Date/Time   NA 143 08/28/2014 0918   NA 141 01/12/2012 1318   K 4.0 08/28/2014 0918   K 3.9 01/12/2012 1318   CL 108* 01/31/2013 0919   CL 106 01/12/2012 1318   CO2 25 08/28/2014 0918   CO2 21 01/12/2012 1318   BUN 10.9 08/28/2014 0918   BUN 13 01/12/2012 1318   CREATININE 0.8 08/28/2014 0918   CREATININE 0.76 01/12/2012 1318      Component Value Date/Time   CALCIUM 9.8 08/28/2014 0918   CALCIUM 10.5 01/12/2012 1318   ALKPHOS 54 08/28/2014 0918   ALKPHOS 49 01/12/2012 1318   AST 22 08/28/2014 0918   AST 15 01/12/2012 1318   ALT 22 08/28/2014 0918   ALT 14 01/12/2012 1318   BILITOT 0.47 08/28/2014 0918   BILITOT 0.4 01/12/2012 1318       Lab Results  Component Value Date   LABCA2 12 01/12/2012    STUDIES: Mm Digital Diagnostic Bilat  08/31/2014   CLINICAL  DATA:  History of left lumpectomy with radiation therapy in 2011.  EXAM: DIGITAL DIAGNOSTIC  BILATERAL MAMMOGRAM WITH CAD  COMPARISON:  05/03/2014 and earlier  ACR Breast Density Category c: The breast tissue is heterogeneously dense, which may obscure small masses.  FINDINGS: Postoperative changes are seen in the left breast.  Mammographic images were processed with CAD.  IMPRESSION: No mammographic evidence for malignancy.  RECOMMENDATION: Diagnostic mammogram is suggested in 1 year. (Code:DM-B-01Y)  I have discussed the findings and recommendations with the patient. Results were also provided in writing at the conclusion of the visit. If applicable, a reminder letter will be sent to the patient regarding the next appointment.  BI-RADS CATEGORY  2: Benign.   Electronically Signed   By: Shon Hale M.D.   On: 08/31/2014 11:14    ASSESSMENT: 53 y.o. [place] woman known to be BRCA1 and BRCA 2-negative, status post left lumpectomy in October 2011 for a pT1b pN0(i+), Stage IA  invasive ductal carcinoma, grade 2, strongly estrogen and progesterone receptor positive, HER-2/neu-negative, with an MIB-1 of 19%.    (1) Received adjuvant radiation therapy completed in mid January 2012  (2) on tamoxifen January to April 2012  (3) s/p bilateral salpingo-oophorectomy May 2012  (4) on letrozole since June 2012.   PLAN: Lilliemae is doing well as far as her breast cancer is concerned. The labs were reviewed in detail and were entirely normal. Her mammogram last week was unremarkable. She is now 4 years out from her definitive surgery with no evidence of recurrent disease. She is tolerating the letrozole and the plan is to continue until June of next year, to complete 5 years of antiestrogen therapy. She is due for a repeat bone density scan this year and I have place orders for this to be completed.   I am writing her a prescription for 37.3m venlafaxine daily for her hot flashes. She is aware that we can increase  the dose if necessary to gain adequate control. I have encouraged her to try coconut oil for her vaginal dryness. A great number of our patients use this with good results.   KKeyairrawill return in June 2016 for labs and a follow up visit. At this time she will be eligible to "graduate" from  follow up. She understands and agrees with this plan. She knows the goal of treatment in her case is cure. She has been encouraged to call with any issues that might arise before her next visit here.   Dana Moran    09/04/2014

## 2014-09-04 NOTE — Addendum Note (Signed)
Addended by: Marcelino Duster on: 09/04/2014 11:50 AM   Modules accepted: Orders

## 2014-09-04 NOTE — Telephone Encounter (Signed)
, °

## 2015-01-01 ENCOUNTER — Ambulatory Visit (INDEPENDENT_AMBULATORY_CARE_PROVIDER_SITE_OTHER): Payer: 59 | Admitting: Family Medicine

## 2015-01-01 ENCOUNTER — Ambulatory Visit (INDEPENDENT_AMBULATORY_CARE_PROVIDER_SITE_OTHER): Payer: 59

## 2015-01-01 VITALS — BP 176/92 | HR 63 | Temp 98.1°F | Resp 16 | Ht 63.0 in | Wt 128.0 lb

## 2015-01-01 DIAGNOSIS — Y92099 Unspecified place in other non-institutional residence as the place of occurrence of the external cause: Secondary | ICD-10-CM | POA: Diagnosis not present

## 2015-01-01 DIAGNOSIS — M25532 Pain in left wrist: Secondary | ICD-10-CM

## 2015-01-01 DIAGNOSIS — W19XXXA Unspecified fall, initial encounter: Secondary | ICD-10-CM

## 2015-01-01 DIAGNOSIS — Y92009 Unspecified place in unspecified non-institutional (private) residence as the place of occurrence of the external cause: Principal | ICD-10-CM

## 2015-01-01 NOTE — Progress Notes (Signed)
Subjective: 55 year old lady who is generally healthy. She tripped and fell in her yard while trying to get her dog. She landed with outstretched arms, primarily on the left wrist. She's been having pain for the last several hours since the fall. She went on to her meeting and then came here. She has only had one fracture which was when she was a child. She has some abrasions on her knees also. She tripped in a hole in the yard. Nothing of concern about the fall.   Objective: Left wrist is tender just distal to the radius, at the base of the left thumb and anatomical snuffbox area. No obvious deformity. She has no sensation in motion of her fingers with good flexion and extension. She has abrasions on both knees.  Assessment: Left wrist pain Fall, secondary to tripping in yard  Plan: Wear spica splint on the wrist. Return if further problems.  [facility] reading (PRIMARY) by  Dr. Linna Darner No fracture.  Artifact.Marland Kitchen

## 2015-01-01 NOTE — Patient Instructions (Signed)
Ice Wrist splint Return if problems

## 2015-02-05 ENCOUNTER — Other Ambulatory Visit: Payer: Self-pay | Admitting: *Deleted

## 2015-02-05 MED ORDER — VENLAFAXINE HCL ER 37.5 MG PO CP24: 37.5000 mg | ORAL_CAPSULE | Freq: Every day | ORAL | Status: DC

## 2015-02-06 ENCOUNTER — Other Ambulatory Visit: Payer: Self-pay | Admitting: Nurse Practitioner

## 2015-02-08 ENCOUNTER — Telehealth: Payer: Self-pay | Admitting: *Deleted

## 2015-02-08 NOTE — Telephone Encounter (Signed)
Called pt to inform her that Rx for venlafaxine is at her Pharmacy to pick-up. Left message on VM.

## 2015-02-13 ENCOUNTER — Other Ambulatory Visit: Payer: Self-pay | Admitting: *Deleted

## 2015-02-13 DIAGNOSIS — C50919 Malignant neoplasm of unspecified site of unspecified female breast: Secondary | ICD-10-CM

## 2015-02-13 MED ORDER — LETROZOLE 2.5 MG PO TABS: 2.5000 mg | ORAL_TABLET | Freq: Every day | ORAL | Status: DC

## 2015-02-13 MED ORDER — VENLAFAXINE HCL ER 37.5 MG PO CP24: 37.5000 mg | ORAL_CAPSULE | Freq: Every day | ORAL | Status: DC

## 2015-02-13 NOTE — Telephone Encounter (Signed)
Pt. notified that Venlaf[facility]ine was called into [facility] and will be sent out in 8-10 days. Letrazole was called in to [facility] at patient request.

## 2015-04-02 ENCOUNTER — Other Ambulatory Visit (HOSPITAL_BASED_OUTPATIENT_CLINIC_OR_DEPARTMENT_OTHER): Payer: 59

## 2015-04-02 DIAGNOSIS — C50412 Malignant neoplasm of upper-outer quadrant of left female breast: Secondary | ICD-10-CM

## 2015-04-02 DIAGNOSIS — C50512 Malignant neoplasm of lower-outer quadrant of left female breast: Secondary | ICD-10-CM

## 2015-04-02 LAB — CBC WITH DIFFERENTIAL/PLATELET
BASO%: 0.6 % (ref 0.0–2.0)
Basophils Absolute: 0 10*3/uL (ref 0.0–0.1)
EOS%: 2.3 % (ref 0.0–7.0)
Eosinophils Absolute: 0.1 10*3/uL (ref 0.0–0.5)
HEMATOCRIT: 40.4 % (ref 34.8–46.6)
HGB: 13.6 g/dL (ref 11.6–15.9)
LYMPH%: 28.2 % (ref 14.0–49.7)
MCH: 30.5 pg (ref 25.1–34.0)
MCHC: 33.7 g/dL (ref 31.5–36.0)
MCV: 90.3 fL (ref 79.5–101.0)
MONO#: 0.4 10*3/uL (ref 0.1–0.9)
MONO%: 6.9 % (ref 0.0–14.0)
NEUT#: 3.4 10*3/uL (ref 1.5–6.5)
NEUT%: 62 % (ref 38.4–76.8)
Platelets: 299 10*3/uL (ref 145–400)
RBC: 4.47 10*6/uL (ref 3.70–5.45)
RDW: 12.8 % (ref 11.2–14.5)
WBC: 5.5 10*3/uL (ref 3.9–10.3)
lymph#: 1.5 10*3/uL (ref 0.9–3.3)

## 2015-04-02 LAB — COMPREHENSIVE METABOLIC PANEL (CC13)
ALT: 16 U/L (ref 0–55)
ANION GAP: 13 meq/L — AB (ref 3–11)
AST: 20 U/L (ref 5–34)
Albumin: 4.1 g/dL (ref 3.5–5.0)
Alkaline Phosphatase: 50 U/L (ref 40–150)
BILIRUBIN TOTAL: 0.5 mg/dL (ref 0.20–1.20)
BUN: 15.8 mg/dL (ref 7.0–26.0)
CHLORIDE: 105 meq/L (ref 98–109)
CO2: 25 mEq/L (ref 22–29)
Calcium: 8.9 mg/dL (ref 8.4–10.4)
Creatinine: 0.7 mg/dL (ref 0.6–1.1)
EGFR: >90 mL/min/{1.73_m2} (ref >90)
Glucose: 94 mg/dl (ref 70–140)
Potassium: 4.3 mEq/L (ref 3.5–5.1)
SODIUM: 143 meq/L (ref 136–145)
TOTAL PROTEIN: 6.9 g/dL (ref 6.4–8.3)

## 2015-04-09 ENCOUNTER — Telehealth: Payer: Self-pay | Admitting: Oncology

## 2015-04-09 ENCOUNTER — Ambulatory Visit (HOSPITAL_BASED_OUTPATIENT_CLINIC_OR_DEPARTMENT_OTHER): Payer: 59 | Admitting: Oncology

## 2015-04-09 VITALS — BP 148/74 | HR 67 | Temp 98.5°F | Resp 18 | Ht 63.0 in | Wt 126.6 lb

## 2015-04-09 DIAGNOSIS — Z170 Estrogen receptor positive status [ER+]: Secondary | ICD-10-CM | POA: Diagnosis not present

## 2015-04-09 DIAGNOSIS — C50412 Malignant neoplasm of upper-outer quadrant of left female breast: Secondary | ICD-10-CM

## 2015-04-09 DIAGNOSIS — Z79811 Long term (current) use of aromatase inhibitors: Secondary | ICD-10-CM | POA: Diagnosis not present

## 2015-04-09 DIAGNOSIS — C50512 Malignant neoplasm of lower-outer quadrant of left female breast: Secondary | ICD-10-CM

## 2015-04-09 DIAGNOSIS — C50919 Malignant neoplasm of unspecified site of unspecified female breast: Secondary | ICD-10-CM

## 2015-04-09 MED ORDER — LETROZOLE 2.5 MG PO TABS: 2.5000 mg | ORAL_TABLET | Freq: Every day | ORAL | Status: DC

## 2015-04-09 NOTE — Telephone Encounter (Signed)
Gave avs & calendar for November.  °

## 2015-04-09 NOTE — Progress Notes (Signed)
ID: Dana Moran   DOB: 06/03/1961  MR#: [id number]  XKP#:[id number]  PCP: Jerlyn Ly, MD GYN: Princess Bruins SU: Excell Seltzer MD OTHER MD: Jarome Matin, MD  CHIEF COMPLAINT: left breast cancer  CURRENT TREATMENT: letrozole  BREAST CANCER HISTORY: From the original intake note:  [name] usually gets yearly mammography but in 2010, it just "went by her", so her last mammogram had been October of 2009.  In October of 2011 she called to set up screening mammography and this was performed July 30, 2010.  The screening mammogram showed some calcifications in the left breast and she was recalled for additional views which were performed August 12, 2010.  There was an area of 2.2 cm with linear calcifications in a ductal distribution, suspicious for DCIS and stereotactic biopsy was performed the same day.  The pathology [id number]) showed high-grade ductal carcinoma in situ with some areas suspicious for invasion.  The in situ tumor was strongly ER and PR positive at 82 and 75% respectively.    With this information, the patient was referred to Dr. Excell Seltzer, and underwent bilateral breast MRIs on October 16th.  This showed a 7 mm enhancing nodule in the outer left breast with no evidence of abnormal or enlarged lymph nodes.  The patient was recalled for ultrasonography October 20th but although the ultrasound showed several simple cysts, there was no sonographic correlate for the mass seen on MRI. Accordingly, the patient had an MRI-guided biopsy on October 25th of the left breast mass that appeared more nodular and this showed invasive ductal carcinoma ([id number]).    With this information, the patient was brought back on October 27th and underwent left axillary sentinel lymph node biopsy and needle localized left breast lumpectomy under Dr. Excell Seltzer.  The final pathology from this procedure ([id number]) showed invasive ductal carcinoma, Grade 2, spanning 7 mm in the setting of  ductal carcinoma in situ, high grade. Margins were negative, the closest being 6 mm for either component.  There was no evidence of lymphovascular invasion.  There was a single sentinel lymph node and it had isolated tumor cells identified by cytokeratin immunohistochemistry only.  The invasive tumor was ER positive at 91%, PR positive at 100%, with a borderline MIB-1 at 19% and was Her-2 negative with a ratio by CISH of 0.97.   The patient accordingly stages as T1b N0 (i+). Her subsequent history is as detailed below.  INTERVAL HISTORY: Dana Moran returns today for follow up of her breast cancer. She continues on letrozole, which she obtains that about $10 a month. She still has hot flashes as the main problem from this drug. She is on venlafaxine, very low dose, and that is working better than gabapentin. Coarse there is room for improvement there. The arthralgias and myalgias she was previously experiencing are no longer a concern.  REVIEW OF SYSTEMS: [name] is very involved in her parents care. They live next door, are in their 80s, and have significant medical problems. This is said daily intervention. Her husband and children are also very helpful with regards to this Dana Moran is nevertheless able to exercise on a regular basis chiefly doing yoga and spin. A detailed review of systems today was otherwise stable  PAST MEDICAL HISTORY: Past Medical History  Diagnosis Date  . Cancer 08/13/10    brast  . Hypertension   Significant for a basal cell carcinoma removed remotely by Dr. Jarome Matin.  History of renal cysts which are being followed.  History of right groin  hernia repair at age 10.   PAST SURGICAL HISTORY: Past Surgical History  Procedure Laterality Date  . Hernia repair  age 46  . Breast surgery  08/28/2010    Lt br lumpectomy  s/p BSO  FAMILY HISTORY No family history on file. The patient's father is alive at age 40.  He has a history of prostate cancer.  The patient's mother is alive at  age 66.  She has a history of polycystic kidney disease.  The patient has one sister, age 66.  There is no history of breast cancer in the family but both the patient's grandmothers were diagnosed with ovarian cancer, one in her 63 and one in her 20.   GYNECOLOGIC HISTORY: The patient is GX P3, first pregnancy to term at age 82.  She underwent BSO May 2012  SOCIAL HISTORY: (updated November 2014) Siah works in Pharmacologist. Her husband, Dana Moran, works in Press photographer for [employer] for [employer].  They have a daughter who graduated from [employer] and 53 year-old twins, a boy and a girl. The patient is not a church attender.     ADVANCED DIRECTIVES: in place  HEALTH MAINTENANCE: History  Substance Use Topics  . Smoking status: Former Research scientist (life sciences)  . Smokeless tobacco: Former Systems developer    Quit date: 01/15/1972  . Alcohol Use: Yes     Comment: 1 glasse wine a day     Colonoscopy:  Feb 2014, Dr. Collene Mares  PAP:  UTD, Dr. Dellis Filbert  Bone density: June 2012, normal  Lipid panel:  No Known Allergies  Current Outpatient Prescriptions  Medication Sig Dispense Refill  . Calcium Carbonate-Vitamin D (CALCIUM-VITAMIN D) 500-200 MG-UNIT per tablet Take 1 tablet by mouth 2 (two) times daily with a meal.    . Cyanocobalamin (B-12) 1000 MCG CAPS Take by mouth.      . gabapentin (NEURONTIN) 300 MG capsule Take 1 capsule (300 mg total) by mouth at bedtime. (Patient not taking: Reported on 01/01/2015) 31 capsule 9  . letrozole (FEMARA) 2.5 MG tablet Take 1 tablet (2.5 mg total) by mouth daily. 70 tablet 0  . Multiple Vitamin (MULTIVITAMIN PO) Take by mouth daily.      Marland Kitchen venlafaxine XR (EFFEXOR-XR) 37.5 MG 24 hr capsule Take 1 capsule (37.5 mg total) by mouth daily with breakfast. 90 capsule 2   No current facility-administered medications for this visit.    OBJECTIVE: Middle-aged white woman who looks well Filed Vitals:   04/09/15 1037  BP: 148/74  Pulse: 67  Temp: 98.5 F (36.9 C)  Resp: 18     Body mass  index is 22.43 kg/(m^2).    ECOG FS: 0 Filed Weights   04/09/15 1037  Weight: 126 lb 9.6 oz (57.425 kg)   Sclerae unicteric, pupils round and equal Oropharynx clear and moist-- no thrush or other lesions No cervical or supraclavicular adenopathy Lungs no rales or rhonchi Heart regular rate and rhythm Abd soft, nontender, positive bowel sounds MSK no focal spinal tenderness, no upper extremity lymphedema Neuro: nonfocal, well oriented, appropriate affect Breasts: The right breast is unremarkable. The left breast is status post lumpectomy and radiation. There is no evidence of local recurrence. The left axilla is benign.   LAB RESULTS: Lab Results  Component Value Date   WBC 5.5 04/02/2015   NEUTROABS 3.4 04/02/2015   HGB 13.6 04/02/2015   HCT 40.4 04/02/2015   MCV 90.3 04/02/2015   PLT 299 04/02/2015      Chemistry  Component Value Date/Time   NA 143 04/02/2015 1020   NA 141 01/12/2012 1318   K 4.3 04/02/2015 1020   K 3.9 01/12/2012 1318   CL 108* 01/31/2013 0919   CL 106 01/12/2012 1318   CO2 25 04/02/2015 1020   CO2 21 01/12/2012 1318   BUN 15.8 04/02/2015 1020   BUN 13 01/12/2012 1318   CREATININE 0.7 04/02/2015 1020   CREATININE 0.76 01/12/2012 1318      Component Value Date/Time   CALCIUM 8.9 04/02/2015 1020   CALCIUM 10.5 01/12/2012 1318   ALKPHOS 50 04/02/2015 1020   ALKPHOS 49 01/12/2012 1318   AST 20 04/02/2015 1020   AST 15 01/12/2012 1318   ALT 16 04/02/2015 1020   ALT 14 01/12/2012 1318   BILITOT 0.50 04/02/2015 1020   BILITOT 0.4 01/12/2012 1318       Lab Results  Component Value Date   LABCA2 12 01/12/2012    STUDIES: Repeat mammography due in late October or early November  ASSESSMENT: 54 y.o. [place] woman known to be BRCA1 and BRCA 2-negative, status post left lumpectomy in October 2011 for a pT1b pN0(i+), Stage IA  invasive ductal carcinoma, grade 2, strongly estrogen and progesterone receptor positive, HER-2/neu-negative, with  an MIB-1 of 19%.    (1) Received adjuvant radiation therapy completed in mid January 2012  (2) on tamoxifen January to April 2012  (3) s/p bilateral salpingo-oophorectomy May 2012  (4) on letrozole since June 2012.   PLAN: Kazia will complete 5 years on anti-estrogens late this year. We reviewed the new data that shows an additional 2-3% decrease in risk of recurrence in patients who choose to take an additional 5 years of aromatase inhibitors. I think that strategy is a good one for patients have node-positive disease or otherwise are at high risk. For patients like Jammie however I am very comfortable stopping at 5 years especially as aromatase inhibitors do have negative effects on bone density which can be quite important in the long run.  The plan accordingly will be for her to have mammography early November see me shortly thereafter and then "graduate"  In the meantime though she could try doubling the venlafaxine to 75 mg daily. If that works for her I will call in a new prescription.  She knows to call for any other issues that may develop before next visit here.  Safiyya Stokes C    04/09/2015

## 2015-04-24 ENCOUNTER — Other Ambulatory Visit: Payer: Self-pay | Admitting: *Deleted

## 2015-04-24 ENCOUNTER — Telehealth: Payer: Self-pay | Admitting: *Deleted

## 2015-04-24 MED ORDER — VENLAFAXINE HCL ER 37.5 MG PO CP24
75.0000 mg | ORAL_CAPSULE | Freq: Every day | ORAL | Status: DC
Start: 2015-04-24 — End: 2015-09-09

## 2015-04-24 NOTE — Telephone Encounter (Signed)
Refill obtained with new dose.  Message left informing pt of above including new dose is a 75mg  XR tablet that she will take once a day.

## 2015-04-24 NOTE — Telephone Encounter (Signed)
Pt called and left message wanting to update Dr. Jana Hakim re: Pt has increased Venlafaxine XR from 37.5mg  to 75mg  - 2 tablets daily with better tolerance.  Pt would like for Dr. Jana Hakim to adjust prescription with the correct dosage. Pt's  Phone   [phone].

## 2015-07-31 ENCOUNTER — Other Ambulatory Visit: Payer: Self-pay | Admitting: Oncology

## 2015-07-31 DIAGNOSIS — Z853 Personal history of malignant neoplasm of breast: Secondary | ICD-10-CM

## 2015-08-08 ENCOUNTER — Encounter: Payer: Self-pay | Admitting: Podiatry

## 2015-08-08 ENCOUNTER — Ambulatory Visit (INDEPENDENT_AMBULATORY_CARE_PROVIDER_SITE_OTHER): Payer: 59

## 2015-08-08 ENCOUNTER — Ambulatory Visit (INDEPENDENT_AMBULATORY_CARE_PROVIDER_SITE_OTHER): Payer: 59 | Admitting: Podiatry

## 2015-08-08 VITALS — BP 148/72 | HR 74 | Resp 16

## 2015-08-08 DIAGNOSIS — M21619 Bunion of unspecified foot: Secondary | ICD-10-CM

## 2015-08-08 DIAGNOSIS — M21629 Bunionette of unspecified foot: Secondary | ICD-10-CM | POA: Diagnosis not present

## 2015-08-08 NOTE — Progress Notes (Signed)
   Subjective:    Patient ID: Dana Moran, female    DOB: Jan 24, 1961, 54 y.o.   MRN: [id number]  HPI  Pt presents with bilateral bunions, right over left that is painful. She is a very active walker and has tried supportive shoes but problem persists  Review of Systems  All other systems reviewed and are negative.      Objective:   Physical Exam        Assessment & Plan:

## 2015-08-08 NOTE — Progress Notes (Signed)
Subjective:     Patient ID: Dana Moran, female   DOB: Dec 22, 1960, 54 y.o.   MRN: [id number]  HPI patient presents stating I have a painful bunion right over left foot and a painful bunion on the outside of the fifth metatarsal of both feet. States that it's been giving her trouble and it's gotten worse over the last year but has been symptomatic on the right for about 5 years. Patient has tried wider shoes has tried soaks has tried anti-inflammatory's and padding around the bunion site without relief of symptoms   Review of Systems  All other systems reviewed and are negative.      Objective:   Physical Exam  Constitutional: She is oriented to person, place, and time.  Cardiovascular: Intact distal pulses.   Musculoskeletal: Normal range of motion.  Neurological: She is oriented to person, place, and time.  Skin: Skin is warm.  Nursing note and vitals reviewed.  neurovascular status intact muscle strength adequate range of motion within normal limits with patient noted to have good digital perfusion and well oriented 3. Hyperostosis of a moderate size is noted right over left foot with redness around the head of the first metatarsal and pain and around the head of the fifth metatarsal bilateral with pain when palpated. 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 Due to long-standing nature failure to respond to numerous conservative cares surgical intervention has been recommended for the right foot and not currently for the left foot. I discussed Austin-type osteotomy with pin and made osteotomy fifth right with screw and allow patient to be educated concerning condition. Patient wants surgical procedures and is scheduled for surgery and will reappoint for consult. X-ray  report indicate elevation of the 1 to intermetatarsal angle of approximately 15 with tibial sesamoidal shift position and elevation of the 45 intermetatarsal angle with a spur on the head of the fifth metatarsal right

## 2015-08-15 ENCOUNTER — Telehealth: Payer: Self-pay | Admitting: *Deleted

## 2015-08-15 ENCOUNTER — Other Ambulatory Visit: Payer: Self-pay | Admitting: Internal Medicine

## 2015-08-15 DIAGNOSIS — Q613 Polycystic kidney, unspecified: Secondary | ICD-10-CM

## 2015-08-15 NOTE — Telephone Encounter (Signed)
"  I have foot surgery scheduled with Dr. Paulla Dolly for November 15.  I'm calling to see if you can provide me with the out of pocket cost for that surgery.  I have [facility].  I'd like to know what I am in for.

## 2015-08-20 ENCOUNTER — Ambulatory Visit
Admission: RE | Admit: 2015-08-20 | Discharge: 2015-08-20 | Disposition: A | Discharge status: Home / Self Care | Payer: 59 | Source: Ambulatory Visit | Attending: Internal Medicine | Admitting: Internal Medicine

## 2015-08-20 DIAGNOSIS — Q613 Polycystic kidney, unspecified: Secondary | ICD-10-CM

## 2015-08-22 NOTE — Telephone Encounter (Signed)
I'm calling you with your estimate.  You have a $650 deductible of which you have $490.29 remaining.  Your co-insurance is 20%.  So you will have an estimate of about $800.  This is only for the physician fee.  You will have to call the surgical center to get the facility fee and anesthesia fee.  You can make payment arrangements if need be.  "Okay, that's all I needed to know."

## 2015-09-02 ENCOUNTER — Other Ambulatory Visit: Payer: Self-pay | Admitting: *Deleted

## 2015-09-02 DIAGNOSIS — C50512 Malignant neoplasm of lower-outer quadrant of left female breast: Secondary | ICD-10-CM

## 2015-09-03 ENCOUNTER — Ambulatory Visit
Admission: RE | Admit: 2015-09-03 | Discharge: 2015-09-03 | Disposition: A | Discharge status: Home / Self Care | Payer: 59 | Source: Ambulatory Visit | Attending: Oncology | Admitting: Oncology

## 2015-09-03 ENCOUNTER — Other Ambulatory Visit (HOSPITAL_BASED_OUTPATIENT_CLINIC_OR_DEPARTMENT_OTHER): Payer: 59

## 2015-09-03 DIAGNOSIS — Z853 Personal history of malignant neoplasm of breast: Secondary | ICD-10-CM

## 2015-09-03 DIAGNOSIS — C50412 Malignant neoplasm of upper-outer quadrant of left female breast: Secondary | ICD-10-CM | POA: Diagnosis not present

## 2015-09-03 DIAGNOSIS — C50512 Malignant neoplasm of lower-outer quadrant of left female breast: Secondary | ICD-10-CM

## 2015-09-03 LAB — CBC WITH DIFFERENTIAL/PLATELET
BASO%: 1 % (ref 0.0–2.0)
Basophils Absolute: 0 10*3/uL (ref 0.0–0.1)
EOS%: 3.6 % (ref 0.0–7.0)
Eosinophils Absolute: 0.2 10*3/uL (ref 0.0–0.5)
HEMATOCRIT: 39.3 % (ref 34.8–46.6)
HGB: 13.3 g/dL (ref 11.6–15.9)
LYMPH#: 1.6 10*3/uL (ref 0.9–3.3)
LYMPH%: 34.4 % (ref 14.0–49.7)
MCH: 30.5 pg (ref 25.1–34.0)
MCHC: 33.8 g/dL (ref 31.5–36.0)
MCV: 90.4 fL (ref 79.5–101.0)
MONO#: 0.3 10*3/uL (ref 0.1–0.9)
MONO%: 7.4 % (ref 0.0–14.0)
NEUT#: 2.4 10*3/uL (ref 1.5–6.5)
NEUT%: 53.6 % (ref 38.4–76.8)
Platelets: 297 10*3/uL (ref 145–400)
RBC: 4.34 10*6/uL (ref 3.70–5.45)
RDW: 12.5 % (ref 11.2–14.5)
WBC: 4.5 10*3/uL (ref 3.9–10.3)

## 2015-09-03 LAB — COMPREHENSIVE METABOLIC PANEL (CC13)
ALT: 18 U/L (ref 0–55)
AST: 18 U/L (ref 5–34)
Albumin: 4 g/dL (ref 3.5–5.0)
Alkaline Phosphatase: 46 U/L (ref 40–150)
Anion Gap: 6 mEq/L (ref 3–11)
BUN: 12.8 mg/dL (ref 7.0–26.0)
CALCIUM: 9.1 mg/dL (ref 8.4–10.4)
CHLORIDE: 106 meq/L (ref 98–109)
CO2: 27 meq/L (ref 22–29)
CREATININE: 0.8 mg/dL (ref 0.6–1.1)
EGFR: 89 mL/min/{1.73_m2} — ABNORMAL LOW (ref >90)
GLUCOSE: 98 mg/dL (ref 70–140)
POTASSIUM: 4 meq/L (ref 3.5–5.1)
SODIUM: 139 meq/L (ref 136–145)
Total Bilirubin: 0.53 mg/dL (ref 0.20–1.20)
Total Protein: 6.7 g/dL (ref 6.4–8.3)

## 2015-09-04 ENCOUNTER — Telehealth: Payer: Self-pay | Admitting: *Deleted

## 2015-09-04 NOTE — Telephone Encounter (Signed)
Surgery scheduled for 09/17/2015 for an Bertram and Metatarsal Osteotomy 5th right foot was authorized by [facility].  Authorization number is [id number].  Authorization was faxed to the surgical center.

## 2015-09-09 ENCOUNTER — Ambulatory Visit (INDEPENDENT_AMBULATORY_CARE_PROVIDER_SITE_OTHER): Payer: 59 | Admitting: Podiatry

## 2015-09-09 ENCOUNTER — Ambulatory Visit (HOSPITAL_BASED_OUTPATIENT_CLINIC_OR_DEPARTMENT_OTHER): Payer: 59 | Admitting: Oncology

## 2015-09-09 ENCOUNTER — Encounter: Payer: Self-pay | Admitting: Podiatry

## 2015-09-09 VITALS — BP 147/91 | HR 65 | Temp 98.4°F | Resp 18 | Ht 63.0 in | Wt 130.2 lb

## 2015-09-09 VITALS — BP 152/93 | HR 67 | Resp 16 | Ht 62.5 in | Wt 126.0 lb

## 2015-09-09 DIAGNOSIS — C50912 Malignant neoplasm of unspecified site of left female breast: Secondary | ICD-10-CM

## 2015-09-09 DIAGNOSIS — M21629 Bunionette of unspecified foot: Secondary | ICD-10-CM

## 2015-09-09 DIAGNOSIS — Z170 Estrogen receptor positive status [ER+]: Secondary | ICD-10-CM

## 2015-09-09 DIAGNOSIS — C50512 Malignant neoplasm of lower-outer quadrant of left female breast: Secondary | ICD-10-CM

## 2015-09-09 DIAGNOSIS — M21619 Bunion of unspecified foot: Secondary | ICD-10-CM

## 2015-09-09 MED ORDER — VENLAFAXINE HCL ER 75 MG PO CP24: 75.0000 mg | ORAL_CAPSULE | Freq: Every day | ORAL | Status: AC

## 2015-09-09 NOTE — Progress Notes (Signed)
ID: Dana Moran   DOB: [birth year]  MR#: [id number]  YIA#:[id number]  PCP: Jerlyn Ly, MD GYN: Princess Bruins SU: Excell Seltzer MD OTHER MD: Jarome Matin, MD  CHIEF COMPLAINT: left breast cancer  CURRENT TREATMENT: letrozole  BREAST CANCER HISTORY: From the original intake note:  Dana Moran usually gets yearly mammography but in 2010, it just "went by her", so her last mammogram had been October of 2009.  In October of 2011 she called to set up screening mammography and this was performed July 30, 2010.  The screening mammogram showed some calcifications in the left breast and she was recalled for additional views which were performed August 12, 2010.  There was an area of 2.2 cm with linear calcifications in a ductal distribution, suspicious for DCIS and stereotactic biopsy was performed the same day.  The pathology [id number]) showed high-grade ductal carcinoma in situ with some areas suspicious for invasion.  The in situ tumor was strongly ER and PR positive at 82 and 75% respectively.    With this information, the patient was referred to Dr. Excell Seltzer, and underwent bilateral breast MRIs on October 16th.  This showed a 7 mm enhancing nodule in the outer left breast with no evidence of abnormal or enlarged lymph nodes.  The patient was recalled for ultrasonography October 20th but although the ultrasound showed several simple cysts, there was no sonographic correlate for the mass seen on MRI. Accordingly, the patient had an MRI-guided biopsy on October 25th of the left breast mass that appeared more nodular and this showed invasive ductal carcinoma ([id number]).    With this information, the patient was brought back on October 27th and underwent left axillary sentinel lymph node biopsy and needle localized left breast lumpectomy under Dr. Excell Seltzer.  The final pathology from this procedure ([id number]) showed invasive ductal carcinoma, Grade 2, spanning 7 mm in the setting of  ductal carcinoma in situ, high grade. Margins were negative, the closest being 6 mm for either component.  There was no evidence of lymphovascular invasion.  There was a single sentinel lymph node and it had isolated tumor cells identified by cytokeratin immunohistochemistry only.  The invasive tumor was ER positive at 91%, PR positive at 100%, with a borderline MIB-1 at 19% and was Her-2 negative with a ratio by CISH of 0.97.   The patient accordingly stages as T1b N0 (i+). Her subsequent history is as detailed below.  INTERVAL HISTORY: Dana Moran returns today for follow up of her breast cancer. She is completing 5 years of anti-estrogens and will be coming off letrozole later this month. She has tolerated that well. She does have hot flashes as her major symptom. She was able to obtain the drug at a good price.   REVIEW OF SYSTEMS: [name] exercises regularly, doing yoga and spin. Her twins are now in junior high school. Her daughter just graduated from [employer] and after a year with a [employer] will probably study Public health. [name] continues to work and also is very busy with the care of her parents who are in their 53s and lived next door. A detailed review of systems today was otherwise entirely negative.  PAST MEDICAL HISTORY: Past Medical History  Diagnosis Date  . Cancer ([place]) 08/13/10    brast  . Hypertension   Significant for a basal cell carcinoma removed remotely by Dr. Jarome Matin.  History of renal cysts which are being followed.  History of right groin hernia repair at age 20.   PAST SURGICAL  HISTORY: Past Surgical History  Procedure Laterality Date  . Hernia repair  age 13  . Breast surgery  08/28/2010    Lt br lumpectomy  s/p BSO  FAMILY HISTORY No family history on file. The patient's father is alive at age 52.  He has a history of prostate cancer.  The patient's mother is alive at age 64.  She has a history of polycystic kidney disease.  The patient has one sister, age 30.  There  is no history of breast cancer in the family but both the patient's grandmothers were diagnosed with ovarian cancer, one in her 76 and one in her 43.   GYNECOLOGIC HISTORY: The patient is GX P3, first pregnancy to term at age 75.  She underwent BSO May 2012  SOCIAL HISTORY: (updated November 2014) [name] works in Pharmacologist. Her husband, [name], works in Press photographer for [employer] for [employer].  They have a daughter who graduated from [employer] and 53 year-old twins, a boy and a girl. The patient is not a church attender.     ADVANCED DIRECTIVES: in place  HEALTH MAINTENANCE: Social History  Substance Use Topics  . Smoking status: Former Research scientist (life sciences)  . Smokeless tobacco: Former Systems developer    Quit date: 01/15/1972  . Alcohol Use: Yes     Comment: 1 glasse wine a day     Colonoscopy:  Feb 2014, Dr. Collene Mares  PAP:  UTD, Dr. Dellis Filbert  Bone density: June 2012, normal  Lipid panel:  No Known Allergies  Current Outpatient Prescriptions  Medication Sig Dispense Refill  . Calcium Carbonate-Vitamin D (CALCIUM-VITAMIN D) 500-200 MG-UNIT per tablet Take 1 tablet by mouth 2 (two) times daily with a meal.    . Cyanocobalamin (B-12) 1000 MCG CAPS Take by mouth.      . letrozole (FEMARA) 2.5 MG tablet Take 1 tablet (2.5 mg total) by mouth daily. 70 tablet 0  . Multiple Vitamin (MULTIVITAMIN PO) Take by mouth daily.      Marland Kitchen venlafaxine XR (EFFEXOR-XR) 75 MG 24 hr capsule Take 1 capsule (75 mg total) by mouth daily with breakfast. 90 capsule 4   No current facility-administered medications for this visit.    OBJECTIVE: Middle-aged white woman in no acute distress Filed Vitals:   09/09/15 0851  BP: 147/91  Pulse: 65  Temp: 98.4 F (36.9 C)  Resp: 18     Body mass index is 23.07 kg/(m^2).    ECOG FS: 0 Filed Weights   09/09/15 0851  Weight: 130 lb 3.2 oz (59.058 kg)   Sclerae unicteric, EOMs intact Oropharynx clear, dentition in good repair No cervical or supraclavicular adenopathy Lungs no  rales or rhonchi Heart regular rate and rhythm Abd soft, nontender, positive bowel sounds MSK no focal spinal tenderness, no upper extremity lymphedema Neuro: nonfocal, well oriented, appropriate affect Breasts: The right breast is unremarkable per the left breast is status post lumpectomy and radiation. There is no evidence of local recurrence. The left axilla is benign.  LAB RESULTS: Lab Results  Component Value Date   WBC 4.5 09/03/2015   NEUTROABS 2.4 09/03/2015   HGB 13.3 09/03/2015   HCT 39.3 09/03/2015   MCV 90.4 09/03/2015   PLT 297 09/03/2015      Chemistry      Component Value Date/Time   NA 139 09/03/2015 0911   NA 141 01/12/2012 1318   K 4.0 09/03/2015 0911   K 3.9 01/12/2012 1318   CL 108* 01/31/2013 0919   CL 106 01/12/2012  1318   CO2 27 09/03/2015 0911   CO2 21 01/12/2012 1318   BUN 12.8 09/03/2015 0911   BUN 13 01/12/2012 1318   CREATININE 0.8 09/03/2015 0911   CREATININE 0.76 01/12/2012 1318      Component Value Date/Time   CALCIUM 9.1 09/03/2015 0911   CALCIUM 10.5 01/12/2012 1318   ALKPHOS 46 09/03/2015 0911   ALKPHOS 49 01/12/2012 1318   AST 18 09/03/2015 0911   AST 15 01/12/2012 1318   ALT 18 09/03/2015 0911   ALT 14 01/12/2012 1318   BILITOT 0.53 09/03/2015 0911   BILITOT 0.4 01/12/2012 1318       Lab Results  Component Value Date   LABCA2 12 01/12/2012    STUDIES: US Renal  08/20/2015  CLINICAL DATA:  Follow-up cysts. Family history of polycystic kidney disease. EXAM: RENAL / URINARY TRACT ULTRASOUND COMPLETE COMPARISON:  09/19/2012 FINDINGS: Right Kidney: Length: 10.8 cm. Small scattered cysts, approximately 4, the largest 1.4 cm in the midpole. No significant change. No hydronephrosis. Left Kidney: Length: 11.3 cm. Scattered small cysts, the largest in the upper pole medially measuring 1.6 cm and in the midpole measuring 1.6 cm. No significant change. No hydronephrosis. Bladder: Appears normal for degree of bladder distention.  IMPRESSION: Numerous small bilateral renal cysts which appear benign/ simple. No significant change since prior study. Electronically Signed   By: Rolm Baptise M.D.   On: 08/20/2015 15:03   Mm Diag Breast Tomo Bilateral  09/03/2015  CLINICAL DATA:  History of left breast cancer, status post lumpectomy in 2011. Annual exam. The patient is asymptomatic. EXAM: DIGITAL DIAGNOSTIC BILATERAL MAMMOGRAM WITH 3D TOMOSYNTHESIS AND CAD COMPARISON:  Previous exam(s). ACR Breast Density Category c: The breast tissue is heterogeneously dense, which may obscure small masses. FINDINGS: There stable lumpectomy changes in the outer left breast. No mass, nonsurgical distortion, or suspicious microcalcification is identified in either breast to suggest malignancy. Mammographic images were processed with CAD. IMPRESSION: No evidence of malignancy in either breast. RECOMMENDATION: Diagnostic mammogram is suggested in 1 year. (Code:DM-B-01Y) I have discussed the findings and recommendations with the patient. Results were also provided in writing at the conclusion of the visit. If applicable, a reminder letter will be sent to the patient regarding the next appointment. BI-RADS CATEGORY  2: Benign. Electronically Signed   By: Curlene Dolphin M.D.   On: 09/03/2015 11:48     ASSESSMENT: 54 y.o.  woman known to be BRCA1 and BRCA 2-negative, status post left lumpectomy in October 2011 for a pT1b pN0(i+), Stage IA  invasive ductal carcinoma, grade 2, strongly estrogen and progesterone receptor positive, HER-2/neu-negative, with an MIB-1 of 19%.    (1) Received adjuvant radiation therapy completed in mid January 2012  (2) on tamoxifen January to April 2012  (3) s/p bilateral salpingo-oophorectomy May 2012  (4) on letrozole since June 2012, completing 5 years of antiestrogen November 2016   PLAN: Betzabeth is now ready to "graduate". We reviewed the fact that we do have data for an additional 5 years of anti-estrogens, but in  her case that would be overkill and I would anticipate the benefit in terms of risk reduction would be in the 1% our utmost to percent range. This is not a motivator for her.  As far as breast cancer follow-up is concerned all she will need is yearly mammography and a yearly physician breast exam.  I gave her information on our volunteer program and also our survivorship program. She will let us know she  is interested in participating in either.  Of course I will be glad to see Evoleht at any point in the future if and when the need arises, but as of now we are making no further routine appointments for her here.  MAGRINAT,GUSTAV C    09/09/2015

## 2015-09-09 NOTE — Patient Instructions (Signed)
Pre-Operative Instructions  Congratulations, you have decided to take an important step to improving your quality of life.  You can be assured that the doctors of [facility] will be with you every step of the way.  1. Plan to be at the surgery center/hospital at least 1 (one) hour prior to your scheduled time unless otherwise directed by the surgical center/hospital staff.  You must have a responsible adult accompany you, remain during the surgery and drive you home.  Make sure you have directions to the surgical center/hospital and know how to get there on time. 2. For hospital based surgery you will need to obtain a history and physical form from your family physician within 1 month prior to the date of surgery- we will give you a form for you primary physician.  3. We make every effort to accommodate the date you request for surgery.  There are however, times where surgery dates or times have to be moved.  We will contact you as soon as possible if a change in schedule is required.   4. No Aspirin/Ibuprofen for one week before surgery.  If you are on aspirin, any non-steroidal anti-inflammatory medications (Mobic, Aleve, Ibuprofen) you should stop taking it 7 days prior to your surgery.  You make take Tylenol  For pain prior to surgery.  5. Medications- If you are taking daily heart and blood pressure medications, seizure, reflux, allergy, asthma, anxiety, pain or diabetes medications, make sure the surgery center/hospital is aware before the day of surgery so they may notify you which medications to take or avoid the day of surgery. 6. No food or drink after midnight the night before surgery unless directed otherwise by surgical center/hospital staff. 7. No alcoholic beverages 24 hours prior to surgery.  No smoking 24 hours prior to or 24 hours after surgery. 8. Wear loose pants or shorts- loose enough to fit over bandages, boots, and casts. 9. No slip on shoes, sneakers are best. 10. Bring  your boot with you to the surgery center/hospital.  Also bring crutches or a walker if your physician has prescribed it for you.  If you do not have this equipment, it will be provided for you after surgery. 11. If you have not been contracted by the surgery center/hospital by the day before your surgery, call to confirm the date and time of your surgery. 12. Leave-time from work may vary depending on the type of surgery you have.  Appropriate arrangements should be made prior to surgery with your employer. 13. Prescriptions will be provided immediately following surgery by your doctor.  Have these filled as soon as possible after surgery and take the medication as directed. 14. Remove nail polish on the operative foot. 15. Wash the night before surgery.  The night before surgery wash the foot and leg well with the antibacterial soap provided and water paying special attention to beneath the toenails and in between the toes.  Rinse thoroughly with water and dry well with a towel.  Perform this wash unless told not to do so by your physician.  Enclosed: 1 Ice pack (please put in freezer the night before surgery)   1 Hibiclens skin cleaner   Pre-op Instructions  If you have any questions regarding the instructions, do not hesitate to call our office.  [place]: [address] , [place] [address] [phone]  [place]: [address] [place], [place] [address] [phone]  [place]: [address]  , [place] [address] [phone]  Dr. Richard   Tuchman DPM, Dr. Norman Regal DPM Dr. Richard Sikora DPM, Dr. M. Todd Hyatt DPM, Dr. Kathryn Egerton DPM 

## 2015-09-11 NOTE — Progress Notes (Signed)
Subjective:     Patient ID: Dana Moran, female   DOB: Aug 24, 1961, 54 y.o.   MRN: [id number]  HPI patient presents with pain in the right first metatarsal with redness noted around the joint surface and states she's tried wider shoes and she's tried soaks without relief of symptoms   Review of Systems     Objective:   Physical Exam Neurovascular status intact muscle strength adequate with continued redness and pain around the first metatarsal head right with inflammation noted around the head of the metatarsal itself. It is localized in nature and there is structural imbalances    Assessment:     Symptomatic HAV deformity right    Plan:     H&P conditions reviewed with patient and patient wants this corrected. At this time I did review surgery and I allowed her to read consent form reviewing alternative treatments and complications. Patient wants surgery and signs consent form after extensive review and is scheduled for outpatient surgery understanding recovery can be from 6 months to one year.  X-ray report indicates that there is an elevation of the 1 to intermetatarsal angle of approximately 15 with a large prominence of the first metatarsal

## 2015-09-17 ENCOUNTER — Encounter: Payer: Self-pay | Admitting: Podiatry

## 2015-09-17 DIAGNOSIS — M2021 Hallux rigidus, right foot: Secondary | ICD-10-CM | POA: Diagnosis not present

## 2015-09-17 DIAGNOSIS — M2011 Hallux valgus (acquired), right foot: Secondary | ICD-10-CM | POA: Diagnosis not present

## 2015-09-23 ENCOUNTER — Ambulatory Visit (INDEPENDENT_AMBULATORY_CARE_PROVIDER_SITE_OTHER): Payer: 59 | Admitting: Podiatry

## 2015-09-23 ENCOUNTER — Ambulatory Visit (INDEPENDENT_AMBULATORY_CARE_PROVIDER_SITE_OTHER): Payer: 59

## 2015-09-23 VITALS — BP 177/87 | HR 65 | Temp 98.8°F | Resp 16

## 2015-09-23 DIAGNOSIS — M21619 Bunion of unspecified foot: Secondary | ICD-10-CM | POA: Diagnosis not present

## 2015-09-23 DIAGNOSIS — M21629 Bunionette of unspecified foot: Secondary | ICD-10-CM

## 2015-09-23 NOTE — Progress Notes (Signed)
Subjective:     Patient ID: Dana Moran, female   DOB: 02-Apr-1961, 54 y.o.   MRN: [id number]  HPI patient states I'm doing really well with my right foot with diminished discomfort and good alignment and minimal swelling   Review of Systems     Objective:   Physical Exam Neurovascular status intact negative Homans sign noted with well aligned first and fifth metatarsal right with wound edges well coapted and no drainage noted    Assessment:     Doing well post Austin osteotomy metatarsal osteotomy right foot    Plan:     Reviewed x-rays and reapplied sterile dressing instructed on continued immobilization compression elevation and reappoint in 3 weeks or earlier if needed

## 2015-10-18 ENCOUNTER — Ambulatory Visit (INDEPENDENT_AMBULATORY_CARE_PROVIDER_SITE_OTHER): Payer: 59

## 2015-10-18 ENCOUNTER — Ambulatory Visit (INDEPENDENT_AMBULATORY_CARE_PROVIDER_SITE_OTHER): Payer: 59 | Admitting: Podiatry

## 2015-10-18 DIAGNOSIS — Z9889 Other specified postprocedural states: Secondary | ICD-10-CM | POA: Diagnosis not present

## 2015-10-18 DIAGNOSIS — M21629 Bunionette of unspecified foot: Secondary | ICD-10-CM

## 2015-10-18 DIAGNOSIS — M21619 Bunion of unspecified foot: Secondary | ICD-10-CM

## 2015-10-27 NOTE — Progress Notes (Signed)
Subjective:     Patient ID: Dana Moran, female   DOB: [birth year], 54 y.o.   MRN: [id number]  HPI patient states I'm doing well with my right foot with minimal edema and swelling and I'm continuing to wear the surgical shoe   Review of Systems     Objective:   Physical Exam  neurovascular status intact with well healed surgical site right first and fifth metatarsal with wound edges well coapted and good alignment noted    Assessment:      doing well post osteotomy first fifth metatarsal right    Plan:      x-ray reviewed and advised on continued immobilization compression elevation and gradual return to soft shoe gear over the next 4 weeks. Reappoint to recheck in 4 weeks or earlier if needed

## 2015-11-25 ENCOUNTER — Ambulatory Visit (INDEPENDENT_AMBULATORY_CARE_PROVIDER_SITE_OTHER): Payer: 59 | Admitting: Podiatry

## 2015-11-25 ENCOUNTER — Ambulatory Visit (INDEPENDENT_AMBULATORY_CARE_PROVIDER_SITE_OTHER): Payer: 59

## 2015-11-25 ENCOUNTER — Encounter: Payer: Self-pay | Admitting: Podiatry

## 2015-11-25 DIAGNOSIS — M21629 Bunionette of unspecified foot: Secondary | ICD-10-CM

## 2015-11-25 DIAGNOSIS — Z9889 Other specified postprocedural states: Secondary | ICD-10-CM

## 2015-11-25 DIAGNOSIS — M21619 Bunion of unspecified foot: Secondary | ICD-10-CM

## 2015-11-26 NOTE — Progress Notes (Signed)
Subjective:     Patient ID: Dana Moran, female   DOB: 05-17-1961, 55 y.o.   MRN: [id number]  HPI patient states I'm continuing to improve and walking without pain   Review of Systems     Objective:   Physical Exam Neurovascular status intact muscle strength adequate with excellent structural correction first and fifth metatarsal right with wound edges well coapted hallux in rectus position and minimal discomfort upon palpation with good range of motion    Assessment:     Doing well post osteotomy surgery right    Plan:     X-rays reviewed and at this time I've recommended the continuation of conservative care and return to normal activity and will be seen back as needed. Will not do any form of active exercise for at least 4-6 more weeks

## 2016-01-10 ENCOUNTER — Other Ambulatory Visit: Payer: Self-pay | Admitting: *Deleted

## 2016-01-10 ENCOUNTER — Other Ambulatory Visit: Payer: Self-pay | Admitting: Oncology

## 2016-01-24 ENCOUNTER — Ambulatory Visit (INDEPENDENT_AMBULATORY_CARE_PROVIDER_SITE_OTHER): Payer: 59 | Admitting: Family Medicine

## 2016-01-24 VITALS — BP 179/88 | HR 81 | Temp 99.0°F | Resp 16 | Ht 63.0 in | Wt 133.0 lb

## 2016-01-24 DIAGNOSIS — N3001 Acute cystitis with hematuria: Secondary | ICD-10-CM | POA: Diagnosis not present

## 2016-01-24 DIAGNOSIS — R350 Frequency of micturition: Secondary | ICD-10-CM

## 2016-01-24 LAB — POC MICROSCOPIC URINALYSIS (UMFC): Mucus: ABSENT

## 2016-01-24 LAB — POCT URINALYSIS DIP (MANUAL ENTRY)
BILIRUBIN UA: NEGATIVE
Glucose, UA: NEGATIVE
Nitrite, UA: POSITIVE — AB
PH UA: 6
SPEC GRAV UA: 1.025
Urobilinogen, UA: 0.2

## 2016-01-24 MED ORDER — CIPROFLOXACIN HCL 500 MG PO TABS: 500.0000 mg | ORAL_TABLET | Freq: Two times a day (BID) | ORAL | Status: DC

## 2016-01-24 MED ORDER — PHENAZOPYRIDINE HCL 200 MG PO TABS: 200.0000 mg | ORAL_TABLET | Freq: Three times a day (TID) | ORAL | Status: DC | PRN

## 2016-01-24 NOTE — Progress Notes (Signed)
Subjective:    Patient ID: Dana Moran, female    DOB: [birth year], 55 y.o.   MRN: [id number] By signing my name below, I, Zola Button, attest that this documentation has been prepared under the direction and in the presence of Delman Cheadle, MD.  Electronically Signed: Zola Button, Medical Scribe. 01/24/2016. 6:03 PM.  Chief Complaint  Patient presents with  . Urinary Frequency    since this morning, some pressure, dark colored urine    HPI HPI Comments: Dana Moran is a 55 y.o. female who presents to the [facility] complaining of urinary frequency that started this morning. Patient reports having associated pelvic pressure as well as dysuria since this afternoon. She did feel nausea and a brief episode of back pain this afternoon. She does not have Azo at home. Patient denies urinary urgency, vomiting, fever, chills, and diaphoresis. Her last UTI was about 6-8 months ago. She has not been on any antibiotics since that time.  Past Medical History  Diagnosis Date  . Cancer ([place]) 08/13/10    brast  . Hypertension    Past Surgical History  Procedure Laterality Date  . Hernia repair  age 63  . Breast surgery  08/28/2010    Lt br lumpectomy   Current Outpatient Prescriptions on File Prior to Visit  Medication Sig Dispense Refill  . Calcium Carbonate-Vitamin D (CALCIUM-VITAMIN D) 500-200 MG-UNIT per tablet Take 1 tablet by mouth 2 (two) times daily with a meal.    . Cyanocobalamin (B-12) 1000 MCG CAPS Take by mouth.      . venlafaxine XR (EFFEXOR-XR) 75 MG 24 hr capsule Take 1 capsule (75 mg total) by mouth daily with breakfast. 90 capsule 4   No current facility-administered medications on file prior to visit.   No Known Allergies No family history on file. Social History   Social History  . Marital Status: Married    Spouse Name: N/A  . Number of Children: N/A  . Years of Education: N/A   Social History Main Topics  . Smoking status: Former Research scientist (life sciences)  .  Smokeless tobacco: Former Systems developer    Quit date: 01/15/1972  . Alcohol Use: Yes     Comment: 1 glasse wine a day  . Drug Use: No  . Sexual Activity: Not Asked   Other Topics Concern  . None   Social History Narrative    Review of Systems  Constitutional: Negative for fever, chills, diaphoresis, activity change and appetite change.  Gastrointestinal: Positive for nausea. Negative for vomiting, abdominal pain and abdominal distention.  Genitourinary: Positive for dysuria, frequency and pelvic pain. Negative for urgency, hematuria, flank pain, decreased urine volume and difficulty urinating.  Musculoskeletal: Positive for back pain.  Hematological: Negative for adenopathy.  Psychiatric/Behavioral: Negative for sleep disturbance.       Objective:  BP 179/88 mmHg  Pulse 81  Temp(Src) 99 F (37.2 C)  Resp 16  Ht 5\' 3"  (1.6 m)  Wt 133 lb (60.328 kg)  BMI 23.57 kg/m2  SpO2 98%  Physical Exam  Constitutional: She is oriented to person, place, and time. She appears well-developed and well-nourished. No distress.  HENT:  Head: Normocephalic and atraumatic.  Mouth/Throat: Oropharynx is clear and moist. No oropharyngeal exudate.  Eyes: Pupils are equal, round, and reactive to light.  Neck: Neck supple.  Cardiovascular: Normal rate.   Pulmonary/Chest: Effort normal.  Abdominal: Soft. Bowel sounds are normal. There is no tenderness. There is no CVA tenderness.  No  focal tenderness.  Musculoskeletal: She exhibits no edema.  Neurological: She is alert and oriented to person, place, and time. No cranial nerve deficit.  Skin: Skin is warm and dry. No rash noted.  Psychiatric: She has a normal mood and affect. Her behavior is normal.  Nursing note and vitals reviewed.  Results for orders placed or performed in visit on 01/24/16  POCT Microscopic Urinalysis ([facility])  Result Value Ref Range   WBC,UR,HPF,POC Too numerous to count  (A) None WBC/hpf   RBC,UR,HPF,POC Too numerous to count  (A)  None RBC/hpf   Bacteria Many (A) None, Too numerous to count   Mucus Absent Absent   Epithelial Cells, UR Per Microscopy Few (A) None, Too numerous to count cells/hpf  POCT urinalysis dipstick  Result Value Ref Range   Color, UA brown (A) yellow   Clarity, UA cloudy (A) clear   Glucose, UA negative negative   Bilirubin, UA negative negative   Ketones, POC UA trace (5) (A) negative   Spec Grav, UA 1.025    Blood, UA large (A) negative   pH, UA 6.0    Protein Ur, POC =100 (A) negative   Urobilinogen, UA 0.2    Nitrite, UA Positive (A) Negative   Leukocytes, UA small (1+) (A) Negative       Assessment & Plan:   1. Urinary frequency   2. Acute cystitis with hematuria     Orders Placed This Encounter  Procedures  . Urine culture  . POCT Microscopic Urinalysis ([facility])  . POCT urinalysis dipstick    Meds ordered this encounter  Medications  . VALSARTAN PO    Sig: Take by mouth.  . ciprofloxacin (CIPRO) 500 MG tablet    Sig: Take 1 tablet (500 mg total) by mouth 2 (two) times daily.    Dispense:  6 tablet    Refill:  0  . phenazopyridine (PYRIDIUM) 200 MG tablet    Sig: Take 1 tablet (200 mg total) by mouth 3 (three) times daily as needed for pain.    Dispense:  15 tablet    Refill:  0    I personally performed the services described in this documentation, which was scribed in my presence. The recorded information has been reviewed and considered, and addended by me as needed.  Delman Cheadle, MD MPH

## 2016-01-24 NOTE — Patient Instructions (Addendum)
   IF you received an x-ray today, you will receive an invoice from [facility]. Please contact [facility] at [phone] with questions or concerns regarding your invoice.   IF you received labwork today, you will receive an invoice from [facility]. Please contact [facility] at [phone] with questions or concerns regarding your invoice.   Our billing staff will not be able to assist you with questions regarding bills from these companies.  You will be contacted with the lab results as soon as they are available. The fastest way to get your results is to activate your My Chart account. Instructions are located on the last page of this paperwork. If you have not heard from us regarding the results in 2 weeks, please contact this office.      Pyelonephritis, Adult Pyelonephritis is a kidney infection. The kidneys are the organs that filter a person's blood and move waste out of the bloodstream and into the urine. Urine passes from the kidneys, through the ureters, and into the bladder. There are two main types of pyelonephritis:  Infections that come on quickly without any warning (acute pyelonephritis).  Infections that last for a long period of time (chronic pyelonephritis). In most cases, the infection clears up with treatment and does not cause further problems. More severe infections or chronic infections can sometimes spread to the bloodstream or lead to other problems with the kidneys. CAUSES This condition is usually caused by:  Bacteria traveling from the bladder to the kidney through infected urine. The urine in the bladder can become infected with bacteria from:  Bladder infection (cystitis).  Inflammation of the prostate gland (prostatitis).  Sexual intercourse, in females.  Bacteria traveling from the bloodstream to the kidney. RISK FACTORS This condition is more likely to develop in:  Pregnant women.  Older  people.  People who have diabetes.  People who have kidney stones or bladder stones.  People who have other abnormalities of the kidney or ureter.  People who have a catheter placed in the bladder.  People who have cancer.  People who are sexually active.  Women who use spermicides.  People who have had a prior urinary tract infection. SYMPTOMS Symptoms of this condition include:  Frequent urination.  Strong or persistent urge to urinate.  Burning or stinging when urinating.  Abdominal pain.  Back pain.  Pain in the side or flank area.  Fever.  Chills.  Blood in the urine, or dark urine.  Nausea.  Vomiting. DIAGNOSIS This condition may be diagnosed based on:  Medical history and physical exam.  Urine tests.  Blood tests. You may also have imaging tests of the kidneys, such as an ultrasound or CT scan. TREATMENT Treatment for this condition may depend on the severity of the infection.  If the infection is mild and is found early, you may be treated with antibiotic medicines taken by mouth. You will need to drink fluids to remain hydrated.  If the infection is more severe, you may need to stay in the hospital and receive antibiotics given directly into a vein through an IV tube. You may also need to receive fluids through an IV tube if you are not able to remain hydrated. After your hospital stay, you may need to take oral antibiotics for a period of time. Other treatments may be required, depending on the cause of the infection. HOME CARE INSTRUCTIONS Medicines  Take over-the-counter and prescription medicines only as told by your health care provider.    If you were prescribed an antibiotic medicine, take it as told by your health care provider. Do not stop taking the antibiotic even if you start to feel better. General Instructions  Drink enough fluid to keep your urine clear or pale yellow.  Avoid caffeine, tea, and carbonated beverages. They tend to  irritate the bladder.  Urinate often. Avoid holding in urine for long periods of time.  Urinate before and after sex.  After a bowel movement, women should cleanse from front to back. Use each tissue only once.  Keep all follow-up visits as told by your health care provider. This is important. SEEK MEDICAL CARE IF:  Your symptoms do not get better after 2 days of treatment.  Your symptoms get worse.  You have a fever. SEEK IMMEDIATE MEDICAL CARE IF:  You are unable to take your antibiotics or fluids.  You have shaking chills.  You vomit.  You have severe flank or back pain.  You have extreme weakness or fainting.   This information is not intended to replace advice given to you by your health care provider. Make sure you discuss any questions you have with your health care provider.   Document Released: 10/19/2005 Document Revised: 07/10/2015 Document Reviewed: 02/11/2015 Elsevier Interactive Patient Education 2016 Elsevier Inc.  

## 2016-01-27 LAB — URINE CULTURE: Colony Count: >=100000

## 2016-03-11 ENCOUNTER — Ambulatory Visit (INDEPENDENT_AMBULATORY_CARE_PROVIDER_SITE_OTHER): Payer: 59

## 2016-03-11 ENCOUNTER — Encounter: Payer: Self-pay | Admitting: Podiatry

## 2016-03-11 ENCOUNTER — Ambulatory Visit (INDEPENDENT_AMBULATORY_CARE_PROVIDER_SITE_OTHER): Payer: 59 | Admitting: Podiatry

## 2016-03-11 VITALS — BP 149/74 | HR 77 | Resp 16

## 2016-03-11 DIAGNOSIS — M7751 Other enthesopathy of right foot: Secondary | ICD-10-CM

## 2016-03-11 DIAGNOSIS — M778 Other enthesopathies, not elsewhere classified: Secondary | ICD-10-CM

## 2016-03-11 DIAGNOSIS — M779 Enthesopathy, unspecified: Principal | ICD-10-CM

## 2016-03-11 MED ORDER — TRIAMCINOLONE ACETONIDE 10 MG/ML IJ SUSP
10.0000 mg | Freq: Once | INTRAMUSCULAR | Status: AC
  Administered 2016-03-11: 10 mg

## 2016-03-11 MED ORDER — DICLOFENAC SODIUM 75 MG PO TBEC: 75.0000 mg | DELAYED_RELEASE_TABLET | Freq: Two times a day (BID) | ORAL | Status: DC

## 2016-03-11 NOTE — Progress Notes (Signed)
Subjective:     Patient ID: Dana Moran, female   DOB: 03-Dec-1960, 55 y.o.   MRN: [id number]  HPI patient states that she has pain in the plantar aspect of the right first MPJ when she is walking and wearing certain types shoes. Other than that she's doing well with her surgery   Review of Systems     Objective:   Physical Exam Neurovascular status intact muscle strength adequate range of motion within normal limits with patient found to have inflammatory changes in the fibular complex right around the plantar metatarsal phalangeal joint. 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 Today I went ahead and I carefully injected the plantar capsule from a dorsal direction 3 mg Kenalog 5 mg Xylocaine and applied dancer's pad to reduce pressure. Reviewed x-rays and reappoint to recheck again in 4 weeks  X-ray report indicates that there is no indications of sesamoidal injury and pin and screw in place with good alignment noted

## 2016-07-14 NOTE — Progress Notes (Signed)
DOS 11.15.2016 [place] bunionectomy (cutting and moving) bone 1st metatarsal with fixation; met osteotomy with screw 5th metatarsal right

## 2016-08-18 ENCOUNTER — Other Ambulatory Visit: Payer: Self-pay | Admitting: Oncology

## 2016-08-18 DIAGNOSIS — Z853 Personal history of malignant neoplasm of breast: Secondary | ICD-10-CM

## 2016-09-02 ENCOUNTER — Other Ambulatory Visit: Payer: Self-pay | Admitting: *Deleted

## 2016-09-03 ENCOUNTER — Ambulatory Visit
Admission: RE | Admit: 2016-09-03 | Discharge: 2016-09-03 | Disposition: A | Discharge status: Home / Self Care | Payer: 59 | Source: Ambulatory Visit | Attending: Oncology | Admitting: Oncology

## 2016-09-03 DIAGNOSIS — Z853 Personal history of malignant neoplasm of breast: Secondary | ICD-10-CM

## 2017-03-12 DIAGNOSIS — Z85828 Personal history of other malignant neoplasm of skin: Secondary | ICD-10-CM | POA: Diagnosis not present

## 2017-03-12 DIAGNOSIS — L918 Other hypertrophic disorders of the skin: Secondary | ICD-10-CM | POA: Diagnosis not present

## 2017-03-12 DIAGNOSIS — D2262 Melanocytic nevi of left upper limb, including shoulder: Secondary | ICD-10-CM | POA: Diagnosis not present

## 2017-03-12 DIAGNOSIS — L820 Inflamed seborrheic keratosis: Secondary | ICD-10-CM | POA: Diagnosis not present

## 2017-05-10 DIAGNOSIS — D2261 Melanocytic nevi of right upper limb, including shoulder: Secondary | ICD-10-CM | POA: Diagnosis not present

## 2017-05-10 DIAGNOSIS — D2262 Melanocytic nevi of left upper limb, including shoulder: Secondary | ICD-10-CM | POA: Diagnosis not present

## 2017-05-10 DIAGNOSIS — Z85828 Personal history of other malignant neoplasm of skin: Secondary | ICD-10-CM | POA: Diagnosis not present

## 2017-06-30 DIAGNOSIS — N390 Urinary tract infection, site not specified: Secondary | ICD-10-CM | POA: Diagnosis not present

## 2017-06-30 DIAGNOSIS — R300 Dysuria: Secondary | ICD-10-CM | POA: Diagnosis not present

## 2017-08-02 ENCOUNTER — Other Ambulatory Visit: Payer: Self-pay | Admitting: Obstetrics & Gynecology

## 2017-08-02 DIAGNOSIS — Z853 Personal history of malignant neoplasm of breast: Secondary | ICD-10-CM

## 2017-08-04 DIAGNOSIS — Z23 Encounter for immunization: Secondary | ICD-10-CM | POA: Diagnosis not present

## 2017-09-01 DIAGNOSIS — Z0000 Encounter for general adult medical examination without abnormal findings: Secondary | ICD-10-CM | POA: Diagnosis not present

## 2017-09-01 DIAGNOSIS — R300 Dysuria: Secondary | ICD-10-CM | POA: Diagnosis not present

## 2017-09-01 DIAGNOSIS — I10 Essential (primary) hypertension: Secondary | ICD-10-CM | POA: Diagnosis not present

## 2017-09-06 ENCOUNTER — Ambulatory Visit
Admission: RE | Admit: 2017-09-06 | Discharge: 2017-09-06 | Disposition: A | Discharge status: Home / Self Care | Payer: 59 | Source: Ambulatory Visit | Attending: Obstetrics & Gynecology | Admitting: Obstetrics & Gynecology

## 2017-09-06 DIAGNOSIS — R922 Inconclusive mammogram: Secondary | ICD-10-CM | POA: Diagnosis not present

## 2017-09-06 DIAGNOSIS — Z853 Personal history of malignant neoplasm of breast: Secondary | ICD-10-CM

## 2017-09-06 HISTORY — DX: Personal history of irradiation: Z92.3

## 2017-09-08 DIAGNOSIS — R030 Elevated blood-pressure reading, without diagnosis of hypertension: Secondary | ICD-10-CM | POA: Diagnosis not present

## 2017-09-08 DIAGNOSIS — Z23 Encounter for immunization: Secondary | ICD-10-CM | POA: Diagnosis not present

## 2017-09-08 DIAGNOSIS — Z0000 Encounter for general adult medical examination without abnormal findings: Secondary | ICD-10-CM | POA: Diagnosis not present

## 2017-09-08 DIAGNOSIS — E7849 Other hyperlipidemia: Secondary | ICD-10-CM | POA: Diagnosis not present

## 2017-09-08 DIAGNOSIS — Z1212 Encounter for screening for malignant neoplasm of rectum: Secondary | ICD-10-CM | POA: Diagnosis not present

## 2017-09-08 DIAGNOSIS — E559 Vitamin D deficiency, unspecified: Secondary | ICD-10-CM | POA: Diagnosis not present

## 2017-09-16 DIAGNOSIS — M859 Disorder of bone density and structure, unspecified: Secondary | ICD-10-CM | POA: Diagnosis not present

## 2017-10-18 DIAGNOSIS — D519 Vitamin B12 deficiency anemia, unspecified: Secondary | ICD-10-CM | POA: Diagnosis not present

## 2017-12-09 DIAGNOSIS — R05 Cough: Secondary | ICD-10-CM | POA: Diagnosis not present

## 2017-12-09 DIAGNOSIS — J209 Acute bronchitis, unspecified: Secondary | ICD-10-CM | POA: Diagnosis not present

## 2017-12-09 DIAGNOSIS — I10 Essential (primary) hypertension: Secondary | ICD-10-CM | POA: Diagnosis not present

## 2018-04-27 DIAGNOSIS — W57XXXA Bitten or stung by nonvenomous insect and other nonvenomous arthropods, initial encounter: Secondary | ICD-10-CM | POA: Diagnosis not present

## 2018-04-27 DIAGNOSIS — I10 Essential (primary) hypertension: Secondary | ICD-10-CM | POA: Diagnosis not present

## 2018-06-16 ENCOUNTER — Encounter: Payer: Self-pay | Admitting: Genetics

## 2018-07-25 ENCOUNTER — Other Ambulatory Visit: Payer: Self-pay | Admitting: Obstetrics & Gynecology

## 2018-07-25 DIAGNOSIS — Z1231 Encounter for screening mammogram for malignant neoplasm of breast: Secondary | ICD-10-CM

## 2018-08-01 DIAGNOSIS — Z23 Encounter for immunization: Secondary | ICD-10-CM | POA: Diagnosis not present

## 2018-08-08 ENCOUNTER — Encounter: Payer: Self-pay | Admitting: Obstetrics & Gynecology

## 2018-08-08 ENCOUNTER — Ambulatory Visit (INDEPENDENT_AMBULATORY_CARE_PROVIDER_SITE_OTHER): Payer: 59 | Admitting: Obstetrics & Gynecology

## 2018-08-08 VITALS — BP 120/76 | Ht 62.5 in | Wt 145.0 lb

## 2018-08-08 DIAGNOSIS — C50512 Malignant neoplasm of lower-outer quadrant of left female breast: Secondary | ICD-10-CM

## 2018-08-08 DIAGNOSIS — Z170 Estrogen receptor positive status [ER+]: Secondary | ICD-10-CM

## 2018-08-08 DIAGNOSIS — Z90722 Acquired absence of ovaries, bilateral: Secondary | ICD-10-CM

## 2018-08-08 DIAGNOSIS — Z780 Asymptomatic menopausal state: Secondary | ICD-10-CM | POA: Diagnosis not present

## 2018-08-08 DIAGNOSIS — Z01419 Encounter for gynecological examination (general) (routine) without abnormal findings: Secondary | ICD-10-CM

## 2018-08-08 NOTE — Patient Instructions (Signed)
1. Encounter for routine gynecological examination with Papanicolaou smear of cervix Normal gynecologic exam status post BSO.  Pap reflex done.  Breast exam normal, status post left lumpectomy.  Will schedule screening mammogram in November 2019.  Health labs with Dr. Joylene Draft.  Will organize bone density with Dr. Joylene Draft as well.  Osteopenia on bone density in 2017.  Taking vitamin D supplement, calcium intake of 1.5 g/day and regular weightbearing physical activity.  2. Status post bilateral salpingo-oophorectomy (BSO) BSO in May 2012.  3. Menopause present On no hormone replacement therapy.  Still experiencing hot flushes.  Patient is on Effexor XR.  May increase soy products in nutrition.  Using a lubricant for intercourse.  Could also use coconut oil.  4. Malignant neoplasm of lower-outer quadrant of left breast of female, estrogen receptor positive ([place]) Screening mammogram November 2019.  Annel, it was a pleasure seeing you today!  I will inform you of your results as soon as they are available.

## 2018-08-08 NOTE — Progress Notes (Signed)
Dana Moran May 20, 1961 [id number]   History:    57 y.o. G3P2A1L3 Married.  [name] planning to study nursing/midwifery.  Twins Sophomore in [place].  RP:  Established patient presenting for annual gyn exam   HPI: Menopause on no HRT.  Still having hot flushes.  Left Breast Ca Lumpectomy in 08/2010.  BSO in 03/2011 because of Left Breast Cancer ER/PR positive.  No PMB.  No pelvic pain.  No pain with IC, using lubricant.  Urine/BMs normal.  Breasts normal.  BMI 26.10.  Exercising regularly.  BD Osteopenia per patient in 2017, with Dr Joylene Draft.  Colono 2014.  Past medical history,surgical history, family history and social history were all reviewed and documented in the EPIC chart.  Gynecologic History No LMP recorded. Patient is postmenopausal. Contraception: post menopausal status Last Pap: 11/2015. Results were: Negative Last mammogram: 09/2017. Results were: Benign Bone Density: 2017 Osteopenia Dr Joylene Draft Colonoscopy: 2014  Obstetric History OB History  Gravida Para Term Preterm AB Living  3 2     1 3   SAB TAB Ectopic Multiple Live Births  1     1      # Outcome Date GA Lbr Len/2nd Weight Sex Delivery Anes PTL Lv  3 Para           2 Para           1 SAB              ROS: A ROS was performed and pertinent positives and negatives are included in the history.  GENERAL: No fevers or chills. HEENT: No change in vision, no earache, sore throat or sinus congestion. NECK: No pain or stiffness. CARDIOVASCULAR: No chest pain or pressure. No palpitations. PULMONARY: No shortness of breath, cough or wheeze. GASTROINTESTINAL: No abdominal pain, nausea, vomiting or diarrhea, melena or bright red blood per rectum. GENITOURINARY: No urinary frequency, urgency, hesitancy or dysuria. MUSCULOSKELETAL: No joint or muscle pain, no back pain, no recent trauma. DERMATOLOGIC: No rash, no itching, no lesions. ENDOCRINE: No polyuria, polydipsia, no heat or cold intolerance. No recent change in weight.  HEMATOLOGICAL: No anemia or easy bruising or bleeding. NEUROLOGIC: No headache, seizures, numbness, tingling or weakness. PSYCHIATRIC: No depression, no loss of interest in normal activity or change in sleep pattern.     Exam:   BP 120/76   Ht 5' 2.5" (1.588 m)   Wt 145 lb (65.8 kg)   BMI 26.10 kg/m   Body mass index is 26.1 kg/m.  General appearance : Well developed well nourished female. No acute distress HEENT: Eyes: no retinal hemorrhage or exudates,  Neck supple, trachea midline, no carotid bruits, no thyroidmegaly Lungs: Clear to auscultation, no rhonchi or wheezes, or rib retractions  Heart: Regular rate and rhythm, no murmurs or gallops Breast:Examined in sitting and supine position were symmetrical in appearance, no palpable masses or tenderness,  no skin retraction, no nipple inversion, no nipple discharge, no skin discoloration, no axillary or supraclavicular lymphadenopathy Abdomen: no palpable masses or tenderness, no rebound or guarding Extremities: no edema or skin discoloration or tenderness  Pelvic: Vulva: Normal             Vagina: No gross lesions or discharge  Cervix: No gross lesions or discharge.  Pap reflex done  Uterus  RV, normal size, shape and consistency, non-tender and mobile  Adnexa  Without masses or tenderness  Anus: Normal   Assessment/Plan:  57 y.o. female for annual exam   1. Encounter for  routine gynecological examination with Papanicolaou smear of cervix Normal gynecologic exam status post BSO.  Pap reflex done.  Breast exam normal, status post left lumpectomy.  Will schedule screening mammogram in November 2019.  Health labs with Dr. Joylene Draft.  Will organize bone density with Dr. Joylene Draft as well.  Osteopenia on bone density in 2017.  Taking vitamin D supplement, calcium intake of 1.5 g/day and regular weightbearing physical activity.  2. Status post bilateral salpingo-oophorectomy (BSO) BSO in May 2012.  3. Menopause present On no hormone  replacement therapy.  Still experiencing hot flushes.  Patient is on Effexor XR.  May increase soy products in nutrition.  Using a lubricant for intercourse.  Could also use coconut oil.  4. Malignant neoplasm of lower-outer quadrant of left breast of female, estrogen receptor positive ([place]) Screening mammogram November 2019.  Princess Bruins MD, 8:46 AM 08/08/2018

## 2018-08-09 ENCOUNTER — Encounter: Payer: 59 | Admitting: Obstetrics & Gynecology

## 2018-08-10 LAB — PAP IG W/ RFLX HPV ASCU

## 2018-09-07 ENCOUNTER — Ambulatory Visit
Admission: RE | Admit: 2018-09-07 | Discharge: 2018-09-07 | Disposition: A | Discharge status: Home / Self Care | Payer: 59 | Source: Ambulatory Visit | Attending: Obstetrics & Gynecology | Admitting: Obstetrics & Gynecology

## 2018-09-07 DIAGNOSIS — Z1231 Encounter for screening mammogram for malignant neoplasm of breast: Secondary | ICD-10-CM

## 2018-09-26 DIAGNOSIS — D2239 Melanocytic nevi of other parts of face: Secondary | ICD-10-CM | POA: Diagnosis not present

## 2018-09-26 DIAGNOSIS — L820 Inflamed seborrheic keratosis: Secondary | ICD-10-CM | POA: Diagnosis not present

## 2018-09-26 DIAGNOSIS — Z85828 Personal history of other malignant neoplasm of skin: Secondary | ICD-10-CM | POA: Diagnosis not present

## 2018-09-26 DIAGNOSIS — L821 Other seborrheic keratosis: Secondary | ICD-10-CM | POA: Diagnosis not present

## 2018-10-14 DIAGNOSIS — E538 Deficiency of other specified B group vitamins: Secondary | ICD-10-CM | POA: Diagnosis not present

## 2018-10-14 DIAGNOSIS — R82998 Other abnormal findings in urine: Secondary | ICD-10-CM | POA: Diagnosis not present

## 2018-10-14 DIAGNOSIS — Z0000 Encounter for general adult medical examination without abnormal findings: Secondary | ICD-10-CM | POA: Diagnosis not present

## 2018-10-20 DIAGNOSIS — Z0000 Encounter for general adult medical examination without abnormal findings: Secondary | ICD-10-CM | POA: Diagnosis not present

## 2018-10-20 DIAGNOSIS — R7301 Impaired fasting glucose: Secondary | ICD-10-CM | POA: Diagnosis not present

## 2018-10-20 DIAGNOSIS — E7849 Other hyperlipidemia: Secondary | ICD-10-CM | POA: Diagnosis not present

## 2018-10-20 DIAGNOSIS — Z1389 Encounter for screening for other disorder: Secondary | ICD-10-CM | POA: Diagnosis not present

## 2018-10-20 DIAGNOSIS — E559 Vitamin D deficiency, unspecified: Secondary | ICD-10-CM | POA: Diagnosis not present

## 2018-10-21 DIAGNOSIS — Z1212 Encounter for screening for malignant neoplasm of rectum: Secondary | ICD-10-CM | POA: Diagnosis not present

## 2019-08-11 ENCOUNTER — Ambulatory Visit (INDEPENDENT_AMBULATORY_CARE_PROVIDER_SITE_OTHER): Payer: 59 | Admitting: Obstetrics & Gynecology

## 2019-08-11 ENCOUNTER — Other Ambulatory Visit: Payer: Self-pay

## 2019-08-11 ENCOUNTER — Encounter: Payer: Self-pay | Admitting: Obstetrics & Gynecology

## 2019-08-11 VITALS — BP 106/68 | Ht 62.75 in | Wt 145.6 lb

## 2019-08-11 DIAGNOSIS — Z780 Asymptomatic menopausal state: Secondary | ICD-10-CM

## 2019-08-11 DIAGNOSIS — Z170 Estrogen receptor positive status [ER+]: Secondary | ICD-10-CM

## 2019-08-11 DIAGNOSIS — C50512 Malignant neoplasm of lower-outer quadrant of left female breast: Secondary | ICD-10-CM

## 2019-08-11 DIAGNOSIS — M8589 Other specified disorders of bone density and structure, multiple sites: Secondary | ICD-10-CM

## 2019-08-11 DIAGNOSIS — Z01419 Encounter for gynecological examination (general) (routine) without abnormal findings: Secondary | ICD-10-CM | POA: Diagnosis not present

## 2019-08-11 DIAGNOSIS — Z853 Personal history of malignant neoplasm of breast: Secondary | ICD-10-CM

## 2019-08-11 DIAGNOSIS — Z90722 Acquired absence of ovaries, bilateral: Secondary | ICD-10-CM | POA: Diagnosis not present

## 2019-08-11 NOTE — Progress Notes (Signed)
Dana Moran Nov 15, 1960 [id number]   History:    58 y.o. G3P2A1L3 Married.  [name] at home, preparing for Midwifery studies.  Twin Electrical engineer in college.  RP:  Established patient presenting for annual gyn exam   HPI: Postmenopause on no HRT.  Still having hot flushes, better as the weather gets colder.  Left Breast Ca Lumpectomy in 08/2010.  BSO in 03/2011 because of Left Breast Cancer ER/PR positive.  No PMB.  No pelvic pain.  No pain with IC, using lubricant.  Urine/BMs normal.  Breasts normal.  BMI 26.0.  Exercising regularly.  BD Osteopenia mild improvement in 2019, with Dr Joylene Draft.  Colono 2020.  Past medical history,surgical history, family history and social history were all reviewed and documented in the EPIC chart.  Gynecologic History No LMP recorded. Patient is postmenopausal. Contraception: post menopausal status (S/P BSO) Last Pap: 08/2018. Results were: Negative Last mammogram: 09/2018. Results were: Negative Bone Density: 2019 Osteopenia, improved.  Dr Joylene Draft. Colonoscopy: 2020  Obstetric History OB History  Gravida Para Term Preterm AB Living  3 2     1 3   SAB TAB Ectopic Multiple Live Births  1     1      # Outcome Date GA Lbr Len/2nd Weight Sex Delivery Anes PTL Lv  3 Para           2 Para           1 SAB              ROS: A ROS was performed and pertinent positives and negatives are included in the history.  GENERAL: No fevers or chills. HEENT: No change in vision, no earache, sore throat or sinus congestion. NECK: No pain or stiffness. CARDIOVASCULAR: No chest pain or pressure. No palpitations. PULMONARY: No shortness of breath, cough or wheeze. GASTROINTESTINAL: No abdominal pain, nausea, vomiting or diarrhea, melena or bright red blood per rectum. GENITOURINARY: No urinary frequency, urgency, hesitancy or dysuria. MUSCULOSKELETAL: No joint or muscle pain, no back pain, no recent trauma. DERMATOLOGIC: No rash, no itching, no lesions. ENDOCRINE: No  polyuria, polydipsia, no heat or cold intolerance. No recent change in weight. HEMATOLOGICAL: No anemia or easy bruising or bleeding. NEUROLOGIC: No headache, seizures, numbness, tingling or weakness. PSYCHIATRIC: No depression, no loss of interest in normal activity or change in sleep pattern.     Exam:   BP 106/68   Ht 5' 2.75" (1.594 m)   Wt 145 lb 9.6 oz (66 kg)   BMI 26.00 kg/m   Body mass index is 26 kg/m.  General appearance : Well developed well nourished female. No acute distress HEENT: Eyes: no retinal hemorrhage or exudates,  Neck supple, trachea midline, no carotid bruits, no thyroidmegaly Lungs: Clear to auscultation, no rhonchi or wheezes, or rib retractions  Heart: Regular rate and rhythm, no murmurs or gallops Breast:Examined in sitting and supine position were symmetrical in appearance, no palpable masses or tenderness,  no skin retraction, no nipple inversion, no nipple discharge, no skin discoloration, no axillary or supraclavicular lymphadenopathy Abdomen: no palpable masses or tenderness, no rebound or guarding Extremities: no edema or skin discoloration or tenderness  Pelvic: Vulva: Normal             Vagina: No gross lesions or discharge  Cervix: No gross lesions or discharge  Uterus  AV, normal size, shape and consistency, non-tender and mobile  Adnexa  Without masses or tenderness  Anus: Normal   Assessment/Plan:  58 y.o. female for annual exam   1. Well female exam with routine gynecological exam Normal gynecologic exam status post BSO.  Pap test October 2019 was negative, no indication to repeat this year.  Breast exam normal, status post left lumpectomy for breast cancer.  Screening mammogram November 2019 was negative.  Colonoscopy 2020.  Health labs with family physician.  Body mass index 26, continue with fitness and healthy nutrition.  2. Status post bilateral salpingo-oophorectomy (BSO) BSO prophylactically for estrogen receptor positive left  breast cancer.  3. Postmenopause Well on no hormone replacement therapy, improved vasomotor symptoms.  No postmenopausal bleeding.  4. Osteopenia of multiple sites Bone density in 2019 was improved per patient.  Osteopenia on vitamin D supplements, calcium intake of 1200 mg daily and regular weightbearing physical activities.  Followed by Dr. Joylene Draft.  5. Malignant neoplasm of lower-outer quadrant of left breast of female, estrogen receptor positive ([place]) Normal breast exam today.  Screening mammogram November 2019 was negative, patient will repeat a screening mammogram November 2020.  Other orders - chlorthalidone (HYGROTON) 25 MG tablet - Probiotic Product (PROBIOTIC PO); Take by mouth.  Princess Bruins MD, 8:09 AM 08/11/2019

## 2019-08-11 NOTE — Patient Instructions (Signed)
1. Well female exam with routine gynecological exam Normal gynecologic exam status post BSO.  Pap test October 2019 was negative, no indication to repeat this year.  Breast exam normal, status post left lumpectomy for breast cancer.  Screening mammogram November 2019 was negative.  Colonoscopy 2020.  Health labs with family physician.  Body mass index 26, continue with fitness and healthy nutrition.  2. Status post bilateral salpingo-oophorectomy (BSO) BSO prophylactically for estrogen receptor positive left breast cancer.  3. Postmenopause Well on no hormone replacement therapy, improved vasomotor symptoms.  No postmenopausal bleeding.  4. Osteopenia of multiple sites Bone density in 2019 was improved per patient.  Osteopenia on vitamin D supplements, calcium intake of 1200 mg daily and regular weightbearing physical activities.  Followed by Dr. Joylene Draft.  5. Malignant neoplasm of lower-outer quadrant of left breast of female, estrogen receptor positive ([place]) Normal breast exam today.  Screening mammogram November 2019 was negative, patient will repeat a screening mammogram November 2020.  Other orders - chlorthalidone (HYGROTON) 25 MG tablet - Probiotic Product (PROBIOTIC PO); Take by mouth.  Dana Moran, it was a pleasure seeing you today!

## 2019-08-23 ENCOUNTER — Other Ambulatory Visit: Payer: Self-pay | Admitting: Obstetrics & Gynecology

## 2019-08-23 DIAGNOSIS — Z1231 Encounter for screening mammogram for malignant neoplasm of breast: Secondary | ICD-10-CM

## 2019-10-13 ENCOUNTER — Ambulatory Visit: Payer: 59

## 2019-11-17 ENCOUNTER — Telehealth: Payer: 59 | Admitting: Family

## 2019-11-17 DIAGNOSIS — Z20822 Contact with and (suspected) exposure to covid-19: Secondary | ICD-10-CM

## 2019-11-17 NOTE — Progress Notes (Signed)
E-Visit for Corona Virus Screening  Your current symptoms could be consistent with the coronavirus.  Many health care providers can now test patients at their office but not all are.  Dana Moran has multiple testing sites. For information on our [place] testing locations and hours go to [email or web address]  We are enrolling you in our [place] for [place] . Daily you will receive a questionnaire within the [place] website. Our COVID 19 response team will be monitoring your responses daily.  Testing Information: The COVID-19 Community Testing sites will begin testing BY APPOINTMENT ONLY.  You can schedule online at [email or web address]  If you do not have access to a smart phone or computer you may call [phone] for an appointment.  Testing Locations: Appointment schedule is 8 am to 3:30 pm at all sites  [facility] indoors at [address] [place] [address] [facility]  indoors at [place]. [address], [place], [place] [address] [facility] indoors at [address] [place] [address]  Additional testing sites in the Community:  . For [facility] Testing sites in [facility]  FaceUpdate.uy  . For Pop-up testing sites in [place]  BowlDirectory.co.uk  . For Testing sites with regular hours [email or web address]  . For [place] [place] [email or web address]  . For [facility] [email or web address]  . For [facility] testing in [place] and [employer] [email or web address]  . For [facility] testing in [facility]   [email or web address]  For  more information  about community testing call [phone]   We are enrolling you in our [place] for [place] . Daily you will receive a questionnaire within the [place] website. Our COVID 19 response team will be monitoring your responses daily.  Please quarantine yourself while awaiting your test results. Please stay home for a minimum of 10 days from the first day of illness with improving symptoms and you have had 24 hours of no fever (without the use of Tylenol (Acetaminophen) Motrin (Ibuprofen) or any fever reducing medication).  Also - Do not get tested prior to returning to [facility] because once you have had a positive test the test can stay positive for more then a month in some cases.   You should wear a mask or cloth face covering over your nose and mouth if you must be around other people or animals, including pets (even at home). Try to stay at least 6 feet away from other people. This will protect the people around you.  Please continue good preventive care measures, including:  frequent hand-washing, avoid touching your face, cover coughs/sneezes, stay out of crowds and keep a 6 foot distance from others.  COVID-19 is a respiratory illness with symptoms that are similar to the flu. Symptoms are typically mild to moderate, but there have been cases of severe illness and death due to the virus.   The following symptoms may appear 2-14 days after exposure: . Fever . Cough . Shortness of breath or difficulty breathing . Chills . Repeated shaking with chills . Muscle pain . Headache . Sore throat . New loss of taste or smell . Fatigue . Congestion or runny nose . Nausea or vomiting . Diarrhea  Go to the nearest hospital ED for assessment if fever/cough/breathlessness are severe or illness seems like a threat to life.  It is vitally important that if you feel that you have an infection such as this virus or any other virus that you stay home and away from places  where you may spread it to  others.  You should avoid contact with people age 56 and older.     You may also take acetaminophen (Tylenol) as needed for fever.  Reduce your risk of any infection by using the same precautions used for avoiding the common cold or flu:  Marland Kitchen Wash your hands often with soap and warm water for at least 20 seconds.  If soap and water are not readily available, use an alcohol-based hand sanitizer with at least 60% alcohol.  . If coughing or sneezing, cover your mouth and nose by coughing or sneezing into the elbow areas of your shirt or coat, into a tissue or into your sleeve (not your hands). . Avoid shaking hands with others and consider head nods or verbal greetings only. . Avoid touching your eyes, nose, or mouth with unwashed hands.  . Avoid close contact with people who are sick. . Avoid places or events with large numbers of people in one location, like concerts or sporting events. . Carefully consider travel plans you have or are making. . If you are planning any travel outside or inside the [place], visit the CDC's Travelers' Health webpage for the latest health notices. . If you have some symptoms but not all symptoms, continue to monitor at home and seek medical attention if your symptoms worsen. . If you are having a medical emergency, call 911.  HOME CARE . Only take medications as instructed by your medical team. . Drink plenty of fluids and get plenty of rest. . A steam or ultrasonic humidifier can help if you have congestion.   GET HELP RIGHT AWAY IF YOU HAVE EMERGENCY WARNING SIGNS** FOR COVID-19. If you or someone is showing any of these signs seek emergency medical care immediately. Call 911 or proceed to your closest emergency facility if: . You develop worsening high fever. . Trouble breathing . Bluish lips or face . Persistent pain or pressure in the chest . New confusion . Inability to wake or stay awake . You cough up blood. . Your symptoms become more severe  **This  list is not all possible symptoms. Contact your medical provider for any symptoms that are sever or concerning to you.  MAKE SURE YOU   Understand these instructions.  Will watch your condition.  Will get help right away if you are not doing well or get worse.  Your e-visit answers were reviewed by a board certified advanced clinical practitioner to complete your personal care plan.  Depending on the condition, your plan could have included both over the counter or prescription medications.  If there is a problem please reply once you have received a response from your provider.  Your safety is important to [place].  If you have drug allergies check your prescription carefully.    You can use MyChart to ask questions about today's visit, request a non-urgent call back, or ask for a work or school excuse for 24 hours related to this e-Visit. If it has been greater than 24 hours you will need to follow up with your provider, or enter a new e-Visit to address those concerns. You will get an e-mail in the next two days asking about your experience.  I hope that your e-visit has been valuable and will speed your recovery. Thank you for using e-visits.  Approximately 5 minutes was spent documenting and reviewing patient's chart.

## 2019-11-29 ENCOUNTER — Ambulatory Visit: Payer: 59

## 2019-12-18 ENCOUNTER — Ambulatory Visit: Payer: 59

## 2020-01-15 ENCOUNTER — Ambulatory Visit
Admission: RE | Admit: 2020-01-15 | Discharge: 2020-01-15 | Disposition: A | Discharge status: Home / Self Care | Payer: 59 | Source: Ambulatory Visit | Attending: Obstetrics & Gynecology | Admitting: Obstetrics & Gynecology

## 2020-01-15 ENCOUNTER — Other Ambulatory Visit: Payer: Self-pay

## 2020-01-15 DIAGNOSIS — Z1231 Encounter for screening mammogram for malignant neoplasm of breast: Secondary | ICD-10-CM

## 2020-08-14 ENCOUNTER — Other Ambulatory Visit: Payer: Self-pay

## 2020-08-14 ENCOUNTER — Ambulatory Visit (INDEPENDENT_AMBULATORY_CARE_PROVIDER_SITE_OTHER): Payer: 59 | Admitting: Podiatry

## 2020-08-14 ENCOUNTER — Encounter: Payer: Self-pay | Admitting: Podiatry

## 2020-08-14 ENCOUNTER — Ambulatory Visit (INDEPENDENT_AMBULATORY_CARE_PROVIDER_SITE_OTHER): Payer: 59

## 2020-08-14 DIAGNOSIS — M722 Plantar fascial fibromatosis: Secondary | ICD-10-CM

## 2020-08-14 MED ORDER — DICLOFENAC SODIUM 75 MG PO TBEC: 75.0000 mg | DELAYED_RELEASE_TABLET | Freq: Two times a day (BID) | ORAL | 2 refills | Status: AC

## 2020-08-14 NOTE — Progress Notes (Signed)
Subjective:   Patient ID: Dana Moran, female   DOB: 59 y.o.   MRN: [id number]   HPI Patient presents stating she is had a lot of pain in her right heel for around 6 months with gradual worsening it seems to be distal to the insertion.  Also has had bunion surgery doing well but was fixed on number of years ago and patient's not currently smoking likes to be active   Review of Systems  All other systems reviewed and are negative.       Objective:  Physical Exam Vitals and nursing note reviewed.  Constitutional:      Appearance: She is well-developed.  Pulmonary:     Effort: Pulmonary effort is normal.  Musculoskeletal:        General: Normal range of motion.  Skin:    General: Skin is warm.  Neurological:     Mental Status: She is alert.     Neurovascular status intact muscle strength found to be adequate range of motion within normal limits.  Patient is noted to have exquisite discomfort right plantar fascia at the insertional point and distal within the fascia itself with pain upon palpation.  Moderate depression of the arch well-healed surgical sites from previous surgery with good digital perfusion well oriented x3     Assessment:  Acute plantar fasciitis right with good healing of other sites     Plan:  H&P conditions reviewed x-rays reviewed and today I did sterile prep and injected the fascia 3 mg Kenalog 5 mg Xylocaine applied fascial brace to lift up the arch instructed on supportive shoes and stretching exercises.  Placed on diclofenac 75 mg twice daily reappoint to recheck  X-rays indicate no signs of spur formation does have excellent healing from previous osteotomy right first and fifth metatarsal

## 2020-08-14 NOTE — Patient Instructions (Signed)

## 2020-08-21 ENCOUNTER — Ambulatory Visit (INDEPENDENT_AMBULATORY_CARE_PROVIDER_SITE_OTHER): Payer: 59 | Admitting: Podiatry

## 2020-08-21 ENCOUNTER — Encounter: Payer: Self-pay | Admitting: Podiatry

## 2020-08-21 ENCOUNTER — Other Ambulatory Visit: Payer: Self-pay

## 2020-08-21 DIAGNOSIS — M722 Plantar fascial fibromatosis: Secondary | ICD-10-CM

## 2020-08-22 NOTE — Progress Notes (Signed)
Subjective:   Patient ID: Dana [facility], female   DOB: 59 y.o.   MRN: [id number]   HPI Patient presents stating her heel is feeling much better with diminished discomfort upon palpation   ROS      Objective:  Physical Exam  Neurovascular status intact with patient's right heel significantly improved with only mild discomfort upon deep palpation     Assessment:  Plantar fasciitis right improved     Plan:  Reviewed continuation of conservative care anti-inflammatories topical medicines as needed and physical therapy.  Patient is discharged will be seen as needed

## 2020-08-23 ENCOUNTER — Ambulatory Visit (INDEPENDENT_AMBULATORY_CARE_PROVIDER_SITE_OTHER): Payer: 59

## 2020-08-23 DIAGNOSIS — Z23 Encounter for immunization: Secondary | ICD-10-CM

## 2020-10-04 ENCOUNTER — Ambulatory Visit (INDEPENDENT_AMBULATORY_CARE_PROVIDER_SITE_OTHER): Payer: 59 | Admitting: Obstetrics & Gynecology

## 2020-10-04 ENCOUNTER — Encounter: Payer: Self-pay | Admitting: Obstetrics & Gynecology

## 2020-10-04 ENCOUNTER — Other Ambulatory Visit: Payer: Self-pay

## 2020-10-04 VITALS — BP 120/70 | Ht 62.0 in | Wt 143.0 lb

## 2020-10-04 DIAGNOSIS — M8589 Other specified disorders of bone density and structure, multiple sites: Secondary | ICD-10-CM | POA: Diagnosis not present

## 2020-10-04 DIAGNOSIS — Z780 Asymptomatic menopausal state: Secondary | ICD-10-CM

## 2020-10-04 DIAGNOSIS — Z853 Personal history of malignant neoplasm of breast: Secondary | ICD-10-CM

## 2020-10-04 DIAGNOSIS — Z01419 Encounter for gynecological examination (general) (routine) without abnormal findings: Secondary | ICD-10-CM | POA: Diagnosis not present

## 2020-10-04 DIAGNOSIS — Z90722 Acquired absence of ovaries, bilateral: Secondary | ICD-10-CM

## 2020-10-04 DIAGNOSIS — Z170 Estrogen receptor positive status [ER+]: Secondary | ICD-10-CM

## 2020-10-04 DIAGNOSIS — Z9079 Acquired absence of other genital organ(s): Secondary | ICD-10-CM

## 2020-10-04 NOTE — Progress Notes (Signed)
Dana Moran 03/25/1961 [id number]   History:    59 y.o. G3P2A1L3 Married.  [name] at home, preparing for Midwifery studies.  Twin Electrical engineer in college.  RP:  Established patient presenting for annual gyn exam   HPI: Postmenopause on no HRT. Still having hot flushes, better as the weather gets colder. Left Breast Ca Lumpectomy in 08/2010. BSO in 03/2011 because of Left Breast Cancer ER/PR positive. No PMB. No pelvic pain. No pain with IC, using lubricant. Urine/BMs normal. Breasts normal. BMI 26.16. Exercising regularly. BD Osteopenia mild improvement in 2019, with Dr Joylene Draft. Colono 2020.   Past medical history,surgical history, family history and social history were all reviewed and documented in the EPIC chart.  Gynecologic History No LMP recorded. Patient is postmenopausal.  Obstetric History OB History  Gravida Para Term Preterm AB Living  3 2     1 3   SAB TAB Ectopic Multiple Live Births  1     1      # Outcome Date GA Lbr Len/2nd Weight Sex Delivery Anes PTL Lv  3 Para           2 Para           1 SAB              ROS: A ROS was performed and pertinent positives and negatives are included in the history.  GENERAL: No fevers or chills. HEENT: No change in vision, no earache, sore throat or sinus congestion. NECK: No pain or stiffness. CARDIOVASCULAR: No chest pain or pressure. No palpitations. PULMONARY: No shortness of breath, cough or wheeze. GASTROINTESTINAL: No abdominal pain, nausea, vomiting or diarrhea, melena or bright red blood per rectum. GENITOURINARY: No urinary frequency, urgency, hesitancy or dysuria. MUSCULOSKELETAL: No joint or muscle pain, no back pain, no recent trauma. DERMATOLOGIC: No rash, no itching, no lesions. ENDOCRINE: No polyuria, polydipsia, no heat or cold intolerance. No recent change in weight. HEMATOLOGICAL: No anemia or easy bruising or bleeding. NEUROLOGIC: No headache, seizures, numbness, tingling or weakness.  PSYCHIATRIC: No depression, no loss of interest in normal activity or change in sleep pattern.     Exam:   BP 120/70   Ht 5\' 2"  (1.575 m)   Wt 143 lb (64.9 kg)   BMI 26.16 kg/m   Body mass index is 26.16 kg/m.  General appearance : Well developed well nourished female. No acute distress HEENT: Eyes: no retinal hemorrhage or exudates,  Neck supple, trachea midline, no carotid bruits, no thyroidmegaly Lungs: Clear to auscultation, no rhonchi or wheezes, or rib retractions  Heart: Regular rate and rhythm, no murmurs or gallops Breast:Examined in sitting and supine position were symmetrical in appearance, no palpable masses or tenderness,  no skin retraction, no nipple inversion, no nipple discharge, no skin discoloration, no axillary or supraclavicular lymphadenopathy Abdomen: no palpable masses or tenderness, no rebound or guarding Extremities: no edema or skin discoloration or tenderness  Pelvic: Vulva: Normal             Vagina: No gross lesions or discharge  Cervix: No gross lesions or discharge  Uterus  AV, normal size, shape and consistency, non-tender and mobile  Adnexa  Without masses or tenderness  Anus: Normal   Assessment/Plan:  59 y.o. female for annual exam   1. Well female exam with routine gynecological exam Normal gynecologic exam in menopause.  No indication for Pap test this year.  Breast exam normal.  Screening mammogram March 2021 was negative.  Colonoscopy 2020.  Health labs with family physician.  Body mass index 26.16.  Continue with fitness and healthy nutrition.  2. Status post bilateral salpingo-oophorectomy (BSO)  3. Postmenopause Well on no hormone replacement therapy.  No postmenopausal bleeding.  4. Osteopenia of multiple sites Followed by Dr. Joylene Draft.  Vitamin D supplements, calcium intake of 1500 mg daily and regular weightbearing physical activities to continue. 5. Malignant neoplasm of lower-outer quadrant of left breast of female, estrogen  receptor positive (HCC)  Princess Bruins MD, 2:14 PM 10/04/2020

## 2020-11-11 DIAGNOSIS — Z0000 Encounter for general adult medical examination without abnormal findings: Secondary | ICD-10-CM | POA: Diagnosis not present

## 2020-11-11 DIAGNOSIS — R7301 Impaired fasting glucose: Secondary | ICD-10-CM | POA: Diagnosis not present

## 2020-11-11 DIAGNOSIS — E785 Hyperlipidemia, unspecified: Secondary | ICD-10-CM | POA: Diagnosis not present

## 2020-11-11 DIAGNOSIS — E559 Vitamin D deficiency, unspecified: Secondary | ICD-10-CM | POA: Diagnosis not present

## 2020-11-18 DIAGNOSIS — D519 Vitamin B12 deficiency anemia, unspecified: Secondary | ICD-10-CM | POA: Diagnosis not present

## 2020-11-18 DIAGNOSIS — Z1331 Encounter for screening for depression: Secondary | ICD-10-CM | POA: Diagnosis not present

## 2020-11-18 DIAGNOSIS — Z0000 Encounter for general adult medical examination without abnormal findings: Secondary | ICD-10-CM | POA: Diagnosis not present

## 2020-11-25 ENCOUNTER — Other Ambulatory Visit: Payer: Self-pay | Admitting: Internal Medicine

## 2020-11-25 DIAGNOSIS — Q613 Polycystic kidney, unspecified: Secondary | ICD-10-CM

## 2020-11-26 ENCOUNTER — Ambulatory Visit
Admission: RE | Admit: 2020-11-26 | Discharge: 2020-11-26 | Disposition: A | Discharge status: Home / Self Care | Payer: BC Managed Care – PPO | Source: Ambulatory Visit | Attending: Internal Medicine | Admitting: Internal Medicine

## 2020-11-26 DIAGNOSIS — Q613 Polycystic kidney, unspecified: Secondary | ICD-10-CM

## 2020-12-10 DIAGNOSIS — R82998 Other abnormal findings in urine: Secondary | ICD-10-CM | POA: Diagnosis not present

## 2020-12-10 DIAGNOSIS — K921 Melena: Secondary | ICD-10-CM | POA: Diagnosis not present

## 2020-12-20 DIAGNOSIS — M8589 Other specified disorders of bone density and structure, multiple sites: Secondary | ICD-10-CM | POA: Diagnosis not present

## 2021-01-14 ENCOUNTER — Other Ambulatory Visit: Payer: Self-pay | Admitting: Obstetrics & Gynecology

## 2021-01-14 DIAGNOSIS — Z1231 Encounter for screening mammogram for malignant neoplasm of breast: Secondary | ICD-10-CM

## 2021-02-18 DIAGNOSIS — H43813 Vitreous degeneration, bilateral: Secondary | ICD-10-CM | POA: Diagnosis not present

## 2021-03-07 ENCOUNTER — Other Ambulatory Visit: Payer: Self-pay

## 2021-03-07 ENCOUNTER — Ambulatory Visit
Admission: RE | Admit: 2021-03-07 | Discharge: 2021-03-07 | Disposition: A | Discharge status: Home / Self Care | Payer: BC Managed Care – PPO | Source: Ambulatory Visit | Attending: Obstetrics & Gynecology | Admitting: Obstetrics & Gynecology

## 2021-03-07 DIAGNOSIS — Z1231 Encounter for screening mammogram for malignant neoplasm of breast: Secondary | ICD-10-CM

## 2021-06-09 ENCOUNTER — Ambulatory Visit: Payer: Self-pay

## 2021-06-09 ENCOUNTER — Other Ambulatory Visit: Payer: Self-pay

## 2021-06-09 ENCOUNTER — Ambulatory Visit: Payer: BC Managed Care – PPO | Admitting: Family Medicine

## 2021-06-09 VITALS — BP 110/68 | Ht 62.0 in | Wt 138.0 lb

## 2021-06-09 DIAGNOSIS — M25511 Pain in right shoulder: Secondary | ICD-10-CM

## 2021-06-09 NOTE — Patient Instructions (Signed)
You have rotator cuff impingement with bursitis Try to avoid painful activities (overhead activities, lifting with extended arm) as much as possible. Voltaren gel up to 4 times a day topically for pain and inflammation as needed. Can take tylenol in addition to this. Subacromial injection may be beneficial to help with pain and to decrease inflammation. Consider physical therapy with transition to home exercise program. Do home exercise program with theraband and scapular stabilization exercises daily 3 sets of 10 once a day. If not improving at follow-up we will consider injection, physical therapy, and/or nitro patches. Follow up with me in 6 weeks.

## 2021-06-09 NOTE — Progress Notes (Signed)
PCP: Crist Infante, MD  Subjective:   HPI: Patient is a 60 y.o. female here for right shoulder pain.  - Sharp catching pain of right lateral arm inferior to deltoid - Pain with certain movements or use - Pain started 2 months ago, worst in last 1.5-2 weeks - No known injuries or trauma - Does Pilates 3 times weekly, has been modifying extensively - Will wake her up at night if she rolls over onto it or uses her arm in a certain way - Has tried OTC without much relief - No numbness or tingling, no radiation  Past Medical History:  Diagnosis Date   Breast cancer ([place]) 2011   Cancer ([place]) 08/13/10   brast   Hypertension    Personal history of radiation therapy     Current Outpatient Medications on File Prior to Visit  Medication Sig Dispense Refill   amLODipine (NORVASC) 5 MG tablet Take 5 mg by mouth daily.     Calcium Carbonate-Vitamin D (CALCIUM-VITAMIN D) 500-200 MG-UNIT per tablet Take 1 tablet by mouth 2 (two) times daily with a meal.     chlorthalidone (HYGROTON) 25 MG tablet      Cyanocobalamin (B-12) 1000 MCG CAPS Take by mouth.       diclofenac (VOLTAREN) 75 MG EC tablet Take 1 tablet (75 mg total) by mouth 2 (two) times daily. 50 tablet 2   Probiotic Product (PROBIOTIC PO) Take by mouth.     rosuvastatin (CRESTOR) 10 MG tablet Take 10 mg by mouth at bedtime.     telmisartan (MICARDIS) 80 MG tablet Take 80 mg by mouth daily.     venlafaxine XR (EFFEXOR-XR) 75 MG 24 hr capsule Take 1 capsule (75 mg total) by mouth daily with breakfast. 90 capsule 4   No current facility-administered medications on file prior to visit.    Past Surgical History:  Procedure Laterality Date   BREAST BIOPSY     BREAST LUMPECTOMY Left 2011   BREAST SURGERY  08/28/2010   Lt br lumpectomy   HERNIA REPAIR  age 25    No Known Allergies  Social History   Socioeconomic History   Marital status: Married    Spouse name: Not on file   Number of children: Not on file   Years of  education: Not on file   Highest education level: Not on file  Occupational History   Not on file  Tobacco Use   Smoking status: Former   Smokeless tobacco: Former    Quit date: 01/15/1972  Vaping Use   Vaping Use: Never used  Substance and Sexual Activity   Alcohol use: Yes    Comment: 1 glasse wine a day   Drug use: No   Sexual activity: Yes    Birth control/protection: Post-menopausal    Comment: 1st intercourse 60 yo-Fewer than 5 partners   Other Topics Concern   Not on file  Social History Narrative   Not on file   Social Determinants of Health   Financial Resource Strain: Not on file  Food Insecurity: Not on file  Transportation Needs: Not on file  Physical Activity: Not on file  Stress: Not on file  Social Connections: Not on file  Intimate Partner Violence: Not on file    Family History  Problem Relation Age of Onset   Kidney disease Mother    Hypertension Father     BP 110/68   Ht '5\' 2"'$  (1.575 m)   Wt 138 lb (62.6 kg)  BMI 25.24 kg/m   [place] Adult Exercise 06/09/2021  Frequency of aerobic exercise (# of days/week) 7  Average time in minutes 60  Frequency of strengthening activities (# of days/week) 3    No flowsheet data found.  Review of Systems: See HPI above.     Objective:  Blood pressure 110/68, height '5\' 2"'$  (1.575 m), weight 138 lb (62.6 kg).   Gen: NAD, comfortable in exam room  Right shoulder: Inspection: Anatomically normal shoulder, good muscle tone and bulk Palpation: No TTP ROM: Intact full ROM, + painful arc Strength: 5/5 Stability: Stable, no apprehension Special Tests: Positive hawkins.  Negative Neer, negative sulcus Neurovascular: Sensation and pulses intact   Complete MSK u/s right shoulder: Biceps tendon: intact without tenosynovitis Pec major tendon: intact Subscapularis: intact but mild hypoechoic change consistent with tendinopathy AC joint: minimal arthropathy, no effusion Infraspinatus: intact  without abnormalities Supraspinatus: small interstitial tear anterior aspect with cortical irregularity, minimal retraction; mild subacromial bursitis. Posterior glenohumeral joint: no effusion or labral pathology  Impression: Right shoulder subacromial bursitis, age-indeterminate interstitial supraspinatus tear   Assessment & Plan:  1.  Right shoulder pain - Evidence of impingement with subacromial bursitis and also an interstitial supraspinatus tear.  Excellent strength on exam.  Start with home exercise program which was reviewed today.  Voltaren gel.  Consider formal PT, subacromial injection if not improving.  Ezequiel Essex, MD [place]

## 2021-10-06 ENCOUNTER — Other Ambulatory Visit: Payer: Self-pay

## 2021-10-06 ENCOUNTER — Encounter: Payer: Self-pay | Admitting: Obstetrics & Gynecology

## 2021-10-06 ENCOUNTER — Other Ambulatory Visit (HOSPITAL_COMMUNITY)
Admission: RE | Admit: 2021-10-06 | Discharge: 2021-10-06 | Disposition: A | Discharge status: Home / Self Care | Payer: BC Managed Care – PPO | Source: Ambulatory Visit | Attending: Obstetrics & Gynecology | Admitting: Obstetrics & Gynecology

## 2021-10-06 ENCOUNTER — Ambulatory Visit (INDEPENDENT_AMBULATORY_CARE_PROVIDER_SITE_OTHER): Payer: BC Managed Care – PPO | Admitting: Obstetrics & Gynecology

## 2021-10-06 VITALS — BP 126/74 | Ht 63.0 in | Wt 147.0 lb

## 2021-10-06 DIAGNOSIS — Z9079 Acquired absence of other genital organ(s): Secondary | ICD-10-CM

## 2021-10-06 DIAGNOSIS — Z90722 Acquired absence of ovaries, bilateral: Secondary | ICD-10-CM | POA: Diagnosis not present

## 2021-10-06 DIAGNOSIS — Z01419 Encounter for gynecological examination (general) (routine) without abnormal findings: Secondary | ICD-10-CM | POA: Insufficient documentation

## 2021-10-06 DIAGNOSIS — Z170 Estrogen receptor positive status [ER+]: Secondary | ICD-10-CM

## 2021-10-06 DIAGNOSIS — C50512 Malignant neoplasm of lower-outer quadrant of left female breast: Secondary | ICD-10-CM | POA: Diagnosis not present

## 2021-10-06 DIAGNOSIS — Z780 Asymptomatic menopausal state: Secondary | ICD-10-CM | POA: Diagnosis not present

## 2021-10-06 DIAGNOSIS — M8589 Other specified disorders of bone density and structure, multiple sites: Secondary | ICD-10-CM

## 2021-10-06 NOTE — Progress Notes (Signed)
Dana Moran 1961/07/22 [id number]   History:    60 y.o. G3P2A1L3 Married.  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  Has a place at [employer]  [name] in Midwifery studies.  Twin daughter/son graduated.   RP:  Established patient presenting for annual gyn exam    HPI: Postmenopause on no HRT.  No PMB. Still having hot flushes, better as the weather gets colder.  Left Breast Ca Lumpectomy in 08/2010.  BSO in 03/2011 because of Left Breast Cancer ER/PR positive.  Mammo 03/2021 Neg.  No pelvic pain.  No pain with IC, using lubricant.  Pap reflex today. Urine/BMs normal.  Breasts normal.  BMI 26.04.  Exercising regularly.  BD Osteopenia mild improvement in 2019, with Dr Joylene Draft. [facility] 2020.    Past medical history,surgical history, family history and social history were all reviewed and documented in the EPIC chart.  Gynecologic History No LMP recorded. Patient is postmenopausal.  Obstetric History OB History  Gravida Para Term Preterm AB Living  3 2     1 3   SAB IAB Ectopic Multiple Live Births  1     1      # Outcome Date GA Lbr Len/2nd Weight Sex Delivery Anes PTL Lv  3 Para           2 Para           1 SAB              ROS: A ROS was performed and pertinent positives and negatives are included in the history.  GENERAL: No fevers or chills. HEENT: No change in vision, no earache, sore throat or sinus congestion. NECK: No pain or stiffness. CARDIOVASCULAR: No chest pain or pressure. No palpitations. PULMONARY: No shortness of breath, cough or wheeze. GASTROINTESTINAL: No abdominal pain, nausea, vomiting or diarrhea, melena or bright red blood per rectum. GENITOURINARY: No urinary frequency, urgency, hesitancy or dysuria. MUSCULOSKELETAL: No joint or muscle pain, no back pain, no recent trauma. DERMATOLOGIC: No rash, no itching, no lesions. ENDOCRINE: No polyuria, polydipsia, no heat or cold intolerance. No recent change in weight. HEMATOLOGICAL: No anemia or easy bruising or bleeding. NEUROLOGIC: No  headache, seizures, numbness, tingling or weakness. PSYCHIATRIC: No depression, no loss of interest in normal activity or change in sleep pattern.     Exam:   BP 126/74   Ht 5\' 3"  (1.6 m)   Wt 147 lb (66.7 kg)   BMI 26.04 kg/m   Body mass index is 26.04 kg/m.  General appearance : Well developed well nourished female. No acute distress HEENT: Eyes: no retinal hemorrhage or exudates,  Neck supple, trachea midline, no carotid bruits, no thyroidmegaly Lungs: Clear to auscultation, no rhonchi or wheezes, or rib retractions  Heart: Regular rate and rhythm, no murmurs or gallops Breast:Examined in sitting and supine position were symmetrical in appearance, no palpable masses or tenderness,  no skin retraction, no nipple inversion, no nipple discharge, no skin discoloration, no axillary or supraclavicular lymphadenopathy Abdomen: no palpable masses or tenderness, no rebound or guarding Extremities: no edema or skin discoloration or tenderness  Pelvic: Vulva: Normal             Vagina: No gross lesions or discharge  Cervix: No gross lesions or discharge.  Pap reflex done.  Uterus  AV, normal size, shape and consistency, non-tender and mobile  Adnexa  Without masses or tenderness  Anus: Normal   Assessment/Plan:  60 y.o. female for annual exam   1. Encounter  for routine gynecological examination with Papanicolaou smear of cervix Postmenopause on no HRT.  No PMB. Still having hot flushes, better as the weather gets colder.  Left Breast Ca Lumpectomy in 08/2010.  BSO in 03/2011 because of Left Breast Cancer ER/PR positive.  Mammo 03/2021 Neg.  No pelvic pain.  No pain with IC, using lubricant.  Pap reflex today. Urine/BMs normal.  Breasts normal.  BMI 26.04.  Exercising regularly.  BD Osteopenia mild improvement in 2019, with Dr Joylene Draft. [facility]ono 2020. - Cytology - PAP( [place])  2. Postmenopause Postmenopause on no HRT.  No PMB.  3. Status post bilateral salpingo-oophorectomy (BSO)  4.  Malignant neoplasm of lower-outer quadrant of left breast of female, estrogen receptor positive ([place])  Left Breast Ca Lumpectomy in 08/2010.  BSO in 03/2011 because of Left Breast Cancer ER/PR positive.  Mammo 03/2021 Neg.  5. Osteopenia of multiple sites  xercising regularly.  BD Osteopenia mild improvement in 2019, with Dr Joylene Draft.  Princess Bruins MD, 8:47 AM 10/06/2021

## 2021-10-07 LAB — CYTOLOGY - PAP: Diagnosis: NEGATIVE

## 2021-10-08 DIAGNOSIS — D2262 Melanocytic nevi of left upper limb, including shoulder: Secondary | ICD-10-CM | POA: Diagnosis not present

## 2021-10-08 DIAGNOSIS — D225 Melanocytic nevi of trunk: Secondary | ICD-10-CM | POA: Diagnosis not present

## 2021-10-08 DIAGNOSIS — D2261 Melanocytic nevi of right upper limb, including shoulder: Secondary | ICD-10-CM | POA: Diagnosis not present

## 2021-10-08 DIAGNOSIS — Z85828 Personal history of other malignant neoplasm of skin: Secondary | ICD-10-CM | POA: Diagnosis not present

## 2021-12-15 DIAGNOSIS — E559 Vitamin D deficiency, unspecified: Secondary | ICD-10-CM | POA: Diagnosis not present

## 2021-12-15 DIAGNOSIS — R7301 Impaired fasting glucose: Secondary | ICD-10-CM | POA: Diagnosis not present

## 2021-12-15 DIAGNOSIS — E538 Deficiency of other specified B group vitamins: Secondary | ICD-10-CM | POA: Diagnosis not present

## 2021-12-15 DIAGNOSIS — D519 Vitamin B12 deficiency anemia, unspecified: Secondary | ICD-10-CM | POA: Diagnosis not present

## 2021-12-15 DIAGNOSIS — E785 Hyperlipidemia, unspecified: Secondary | ICD-10-CM | POA: Diagnosis not present

## 2021-12-22 DIAGNOSIS — E785 Hyperlipidemia, unspecified: Secondary | ICD-10-CM | POA: Diagnosis not present

## 2021-12-22 DIAGNOSIS — Z23 Encounter for immunization: Secondary | ICD-10-CM | POA: Diagnosis not present

## 2021-12-22 DIAGNOSIS — Z1339 Encounter for screening examination for other mental health and behavioral disorders: Secondary | ICD-10-CM | POA: Diagnosis not present

## 2021-12-22 DIAGNOSIS — Z0000 Encounter for general adult medical examination without abnormal findings: Secondary | ICD-10-CM | POA: Diagnosis not present

## 2021-12-22 DIAGNOSIS — Z1331 Encounter for screening for depression: Secondary | ICD-10-CM | POA: Diagnosis not present

## 2021-12-22 DIAGNOSIS — R82998 Other abnormal findings in urine: Secondary | ICD-10-CM | POA: Diagnosis not present

## 2021-12-22 DIAGNOSIS — I10 Essential (primary) hypertension: Secondary | ICD-10-CM | POA: Diagnosis not present

## 2021-12-22 DIAGNOSIS — Z1212 Encounter for screening for malignant neoplasm of rectum: Secondary | ICD-10-CM | POA: Diagnosis not present

## 2021-12-23 ENCOUNTER — Other Ambulatory Visit: Payer: Self-pay | Admitting: Internal Medicine

## 2021-12-23 DIAGNOSIS — E785 Hyperlipidemia, unspecified: Secondary | ICD-10-CM

## 2022-02-02 ENCOUNTER — Ambulatory Visit
Admission: RE | Admit: 2022-02-02 | Discharge: 2022-02-02 | Disposition: A | Discharge status: Home / Self Care | Payer: No Typology Code available for payment source | Source: Ambulatory Visit | Attending: Internal Medicine | Admitting: Internal Medicine

## 2022-02-02 DIAGNOSIS — E785 Hyperlipidemia, unspecified: Secondary | ICD-10-CM

## 2022-02-17 ENCOUNTER — Other Ambulatory Visit: Payer: Self-pay | Admitting: Obstetrics & Gynecology

## 2022-02-17 DIAGNOSIS — Z1231 Encounter for screening mammogram for malignant neoplasm of breast: Secondary | ICD-10-CM

## 2022-03-02 DIAGNOSIS — H43813 Vitreous degeneration, bilateral: Secondary | ICD-10-CM | POA: Diagnosis not present

## 2022-03-09 ENCOUNTER — Ambulatory Visit
Admission: RE | Admit: 2022-03-09 | Discharge: 2022-03-09 | Disposition: A | Discharge status: Home / Self Care | Payer: BC Managed Care – PPO | Source: Ambulatory Visit | Attending: Obstetrics & Gynecology | Admitting: Obstetrics & Gynecology

## 2022-03-09 DIAGNOSIS — Z1231 Encounter for screening mammogram for malignant neoplasm of breast: Secondary | ICD-10-CM | POA: Diagnosis not present

## 2022-10-21 DIAGNOSIS — L820 Inflamed seborrheic keratosis: Secondary | ICD-10-CM | POA: Diagnosis not present

## 2022-10-21 DIAGNOSIS — Z85828 Personal history of other malignant neoplasm of skin: Secondary | ICD-10-CM | POA: Diagnosis not present

## 2022-10-21 DIAGNOSIS — D2262 Melanocytic nevi of left upper limb, including shoulder: Secondary | ICD-10-CM | POA: Diagnosis not present

## 2022-10-21 DIAGNOSIS — D2261 Melanocytic nevi of right upper limb, including shoulder: Secondary | ICD-10-CM | POA: Diagnosis not present

## 2022-10-21 DIAGNOSIS — L821 Other seborrheic keratosis: Secondary | ICD-10-CM | POA: Diagnosis not present

## 2022-12-03 IMAGING — CT CT CARDIAC CORONARY ARTERY CALCIUM SCORE
3 series · 14 of 20 positions shown, 16 images · non-contrast
Comparison: None.

CLINICAL DATA: Hyperlipidemia

EXAM:
CT CARDIAC CORONARY ARTERY CALCIUM SCORE
TECHNIQUE: Non-contrast imaging through the heart was performed using
prospective ECG gating. Image post processing was performed on an
independent workstation, allowing for quantitative analysis of the
heart and coronary arteries. Note that this exam targets the heart
and the chest was not imaged in its entirety.

[Series 2: calcium scoring 2.00 qr36 bestdiast 70% hrt calciu · axial · 0.39mm/px · z∈[+1605,+1701]mm · 4 of 80 slices shown]
[im 16/80  vessel]
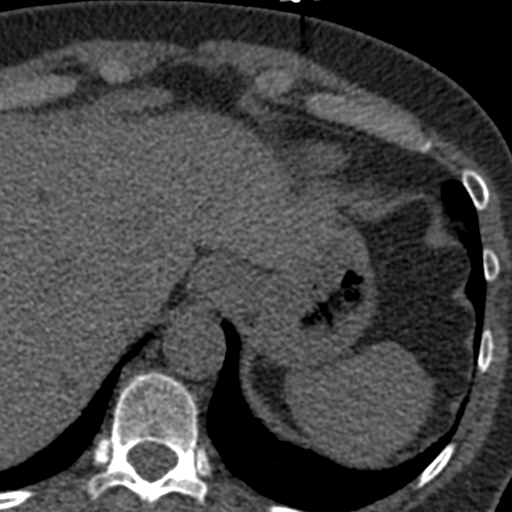
[im 32/80  vessel]
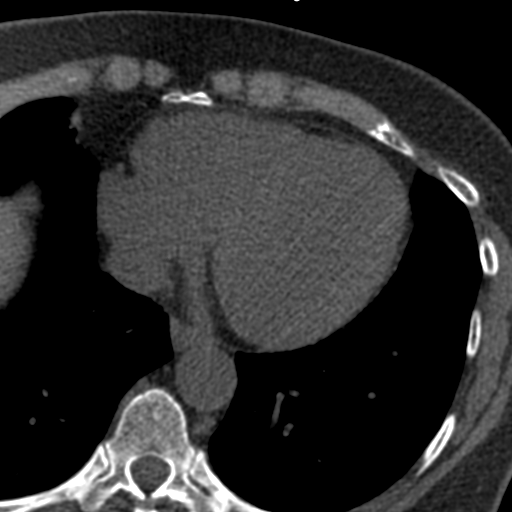
[im 48/80  vessel]
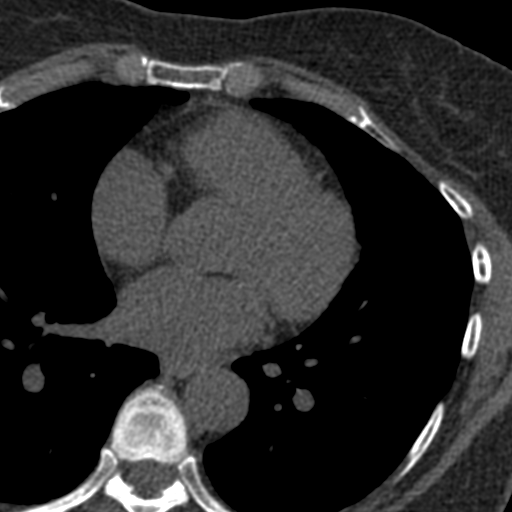
[im 64/80  vessel]
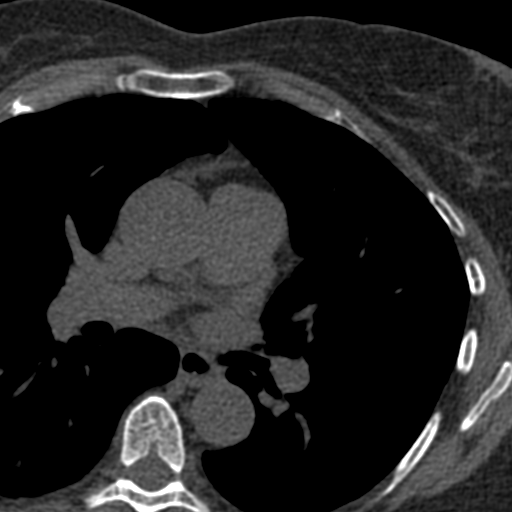

[Series 3: calcium scoring 2.00 br40 bestdiast 70% axial · axial · 0.58mm/px · z∈[+1601,+1705]mm · 5 of 80 slices shown, 7 images]
[im 14/80  vessel]
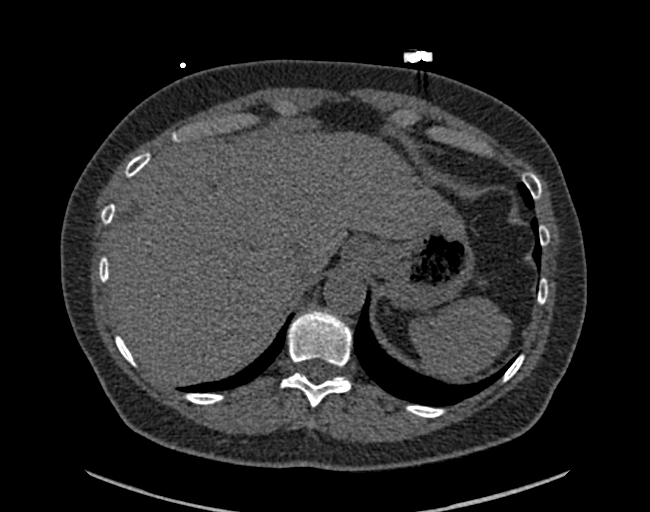
[im 14/80  lung]
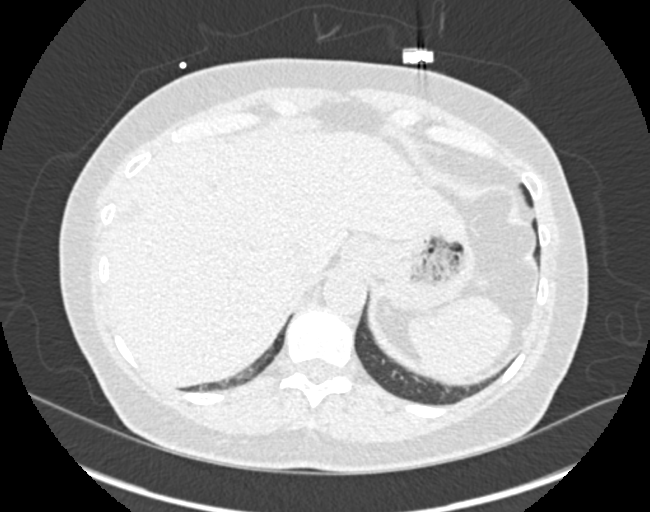
[im 27/80  vessel]
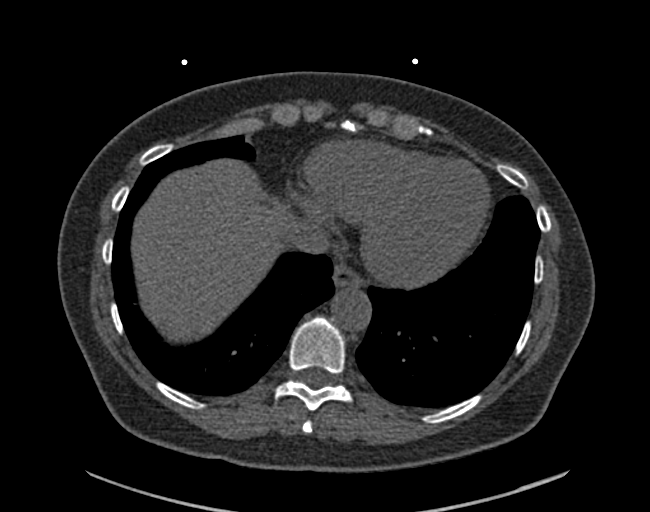
[im 40/80  vessel]
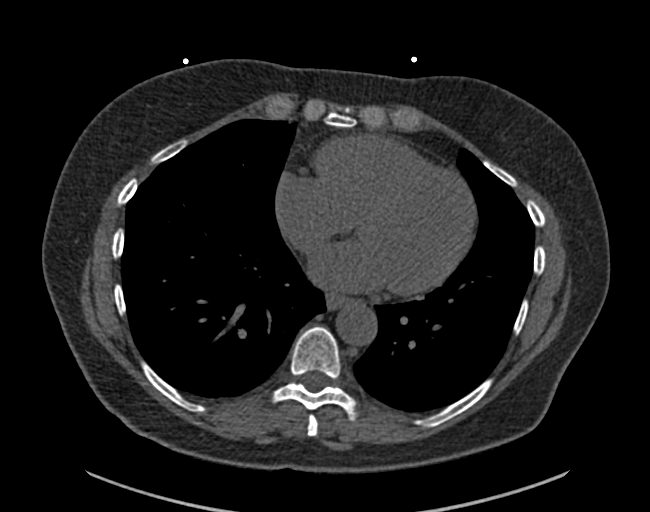
[im 53/80  vessel]
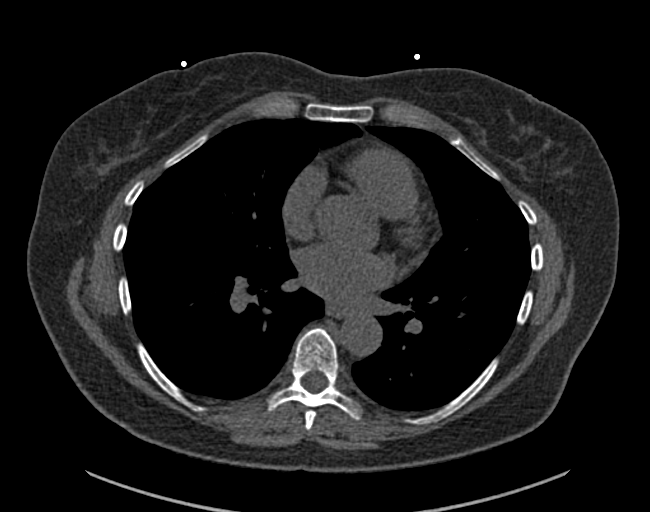
[im 66/80  vessel]
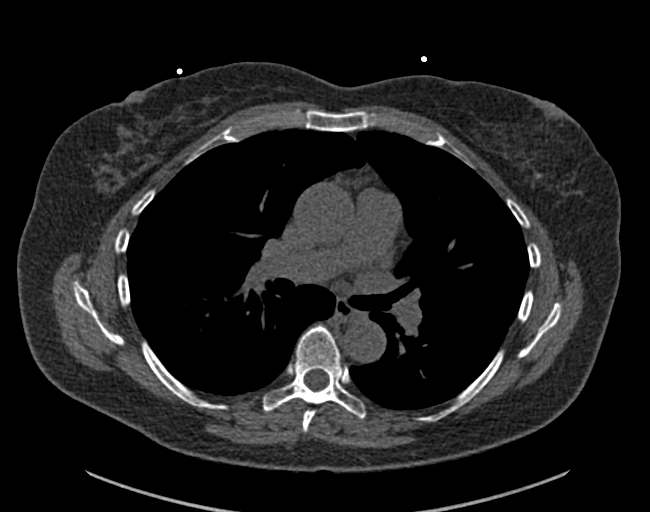
[im 66/80  lung]
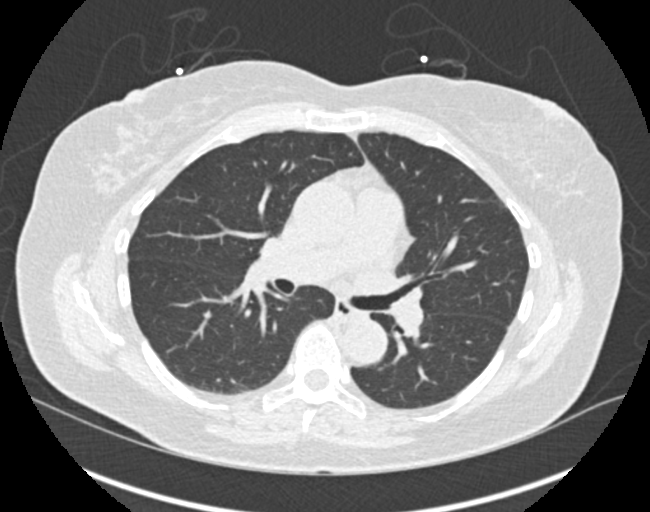

[Series 9: calcium scoring 2.00 br60 bestdiast 70% lungs · axial · 0.58mm/px · z∈[+1601,+1705]mm · 5 of 80 slices shown]
[im 14/80  vessel]
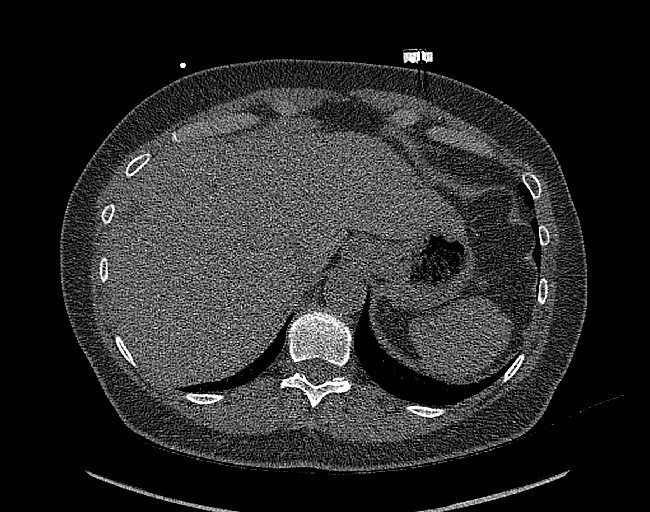
[im 27/80  vessel]
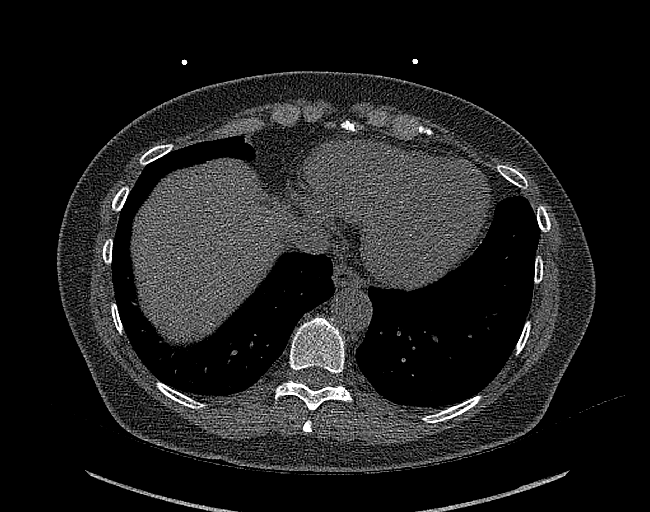
[im 40/80  vessel]
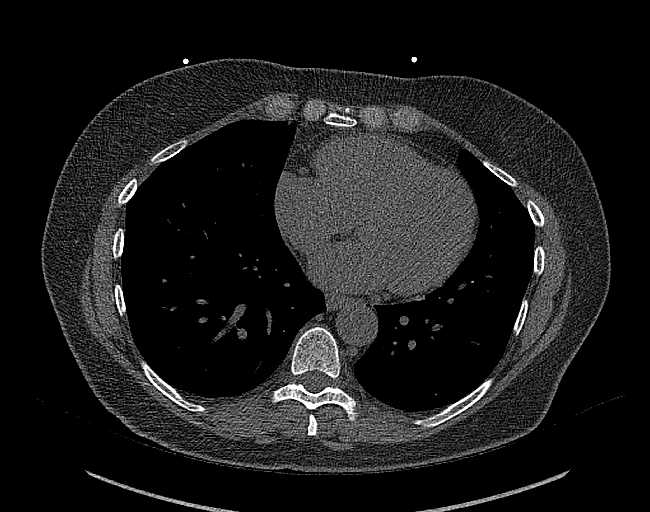
[im 53/80  vessel]
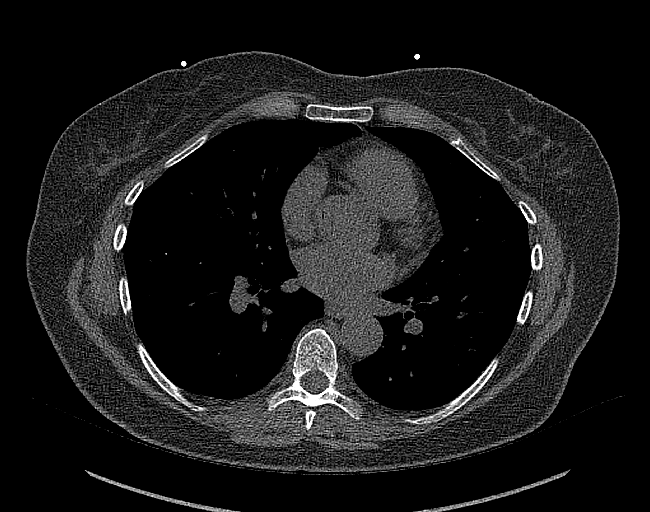
[im 66/80  vessel]
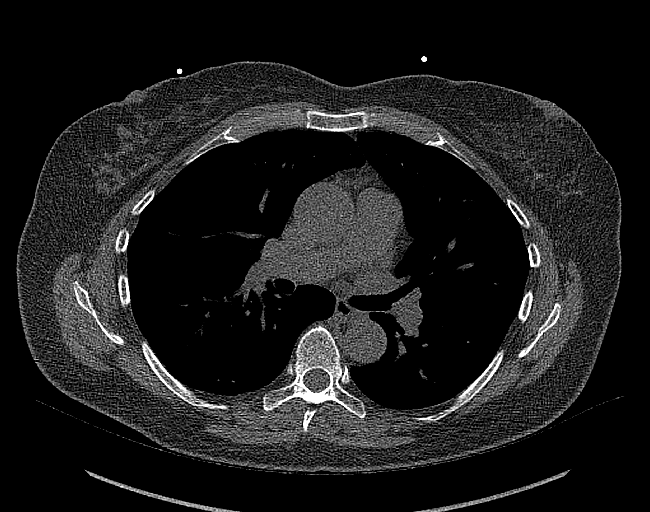

[14 of 20 positions shown; findings below may reference images not displayed]

FINDINGS: CORONARY CALCIUM SCORES:

Left Main: 0

LAD:

LCx:

RCA: 0

Total Agatston Score: 6

[HOSPITAL] percentile: 69

AORTA MEASUREMENTS:

Ascending Aorta: 32 mm

Descending Aorta: 22 mm

OTHER FINDINGS:

Heart is normal size. Aorta normal caliber. No adenopathy. No
confluent opacities or effusions. Scattered hypodensities throughout
the liver, likely cysts. Chest wall soft tissues are unremarkable.
No acute bony abnormality.
IMPRESSION: Total Agatston score: 6

[HOSPITAL] percentile: 69

No acute or significant extracardiac abnormality.

## 2022-12-31 DIAGNOSIS — Z1212 Encounter for screening for malignant neoplasm of rectum: Secondary | ICD-10-CM | POA: Diagnosis not present

## 2022-12-31 DIAGNOSIS — Z780 Asymptomatic menopausal state: Secondary | ICD-10-CM | POA: Diagnosis not present

## 2022-12-31 DIAGNOSIS — R7989 Other specified abnormal findings of blood chemistry: Secondary | ICD-10-CM | POA: Diagnosis not present

## 2022-12-31 DIAGNOSIS — I10 Essential (primary) hypertension: Secondary | ICD-10-CM | POA: Diagnosis not present

## 2022-12-31 DIAGNOSIS — R7301 Impaired fasting glucose: Secondary | ICD-10-CM | POA: Diagnosis not present

## 2022-12-31 DIAGNOSIS — E538 Deficiency of other specified B group vitamins: Secondary | ICD-10-CM | POA: Diagnosis not present

## 2022-12-31 DIAGNOSIS — E559 Vitamin D deficiency, unspecified: Secondary | ICD-10-CM | POA: Diagnosis not present

## 2022-12-31 DIAGNOSIS — E785 Hyperlipidemia, unspecified: Secondary | ICD-10-CM | POA: Diagnosis not present

## 2023-01-07 DIAGNOSIS — R82998 Other abnormal findings in urine: Secondary | ICD-10-CM | POA: Diagnosis not present

## 2023-01-07 DIAGNOSIS — Z0000 Encounter for general adult medical examination without abnormal findings: Secondary | ICD-10-CM | POA: Diagnosis not present

## 2023-01-07 DIAGNOSIS — E559 Vitamin D deficiency, unspecified: Secondary | ICD-10-CM | POA: Diagnosis not present

## 2023-01-07 DIAGNOSIS — I2510 Atherosclerotic heart disease of native coronary artery without angina pectoris: Secondary | ICD-10-CM | POA: Diagnosis not present

## 2023-01-07 DIAGNOSIS — D519 Vitamin B12 deficiency anemia, unspecified: Secondary | ICD-10-CM | POA: Diagnosis not present

## 2023-01-07 DIAGNOSIS — I10 Essential (primary) hypertension: Secondary | ICD-10-CM | POA: Diagnosis not present

## 2023-01-21 ENCOUNTER — Encounter: Payer: Self-pay | Admitting: Obstetrics & Gynecology

## 2023-01-21 ENCOUNTER — Ambulatory Visit (INDEPENDENT_AMBULATORY_CARE_PROVIDER_SITE_OTHER): Payer: BC Managed Care – PPO | Admitting: Obstetrics & Gynecology

## 2023-01-21 VITALS — BP 112/78 | HR 68 | Ht 62.25 in | Wt 147.0 lb

## 2023-01-21 DIAGNOSIS — Z780 Asymptomatic menopausal state: Secondary | ICD-10-CM | POA: Diagnosis not present

## 2023-01-21 DIAGNOSIS — M8589 Other specified disorders of bone density and structure, multiple sites: Secondary | ICD-10-CM

## 2023-01-21 DIAGNOSIS — Z90722 Acquired absence of ovaries, bilateral: Secondary | ICD-10-CM

## 2023-01-21 DIAGNOSIS — Z01419 Encounter for gynecological examination (general) (routine) without abnormal findings: Secondary | ICD-10-CM | POA: Diagnosis not present

## 2023-01-21 DIAGNOSIS — Z170 Estrogen receptor positive status [ER+]: Secondary | ICD-10-CM

## 2023-01-21 DIAGNOSIS — C50512 Malignant neoplasm of lower-outer quadrant of left female breast: Secondary | ICD-10-CM

## 2023-01-21 NOTE — Progress Notes (Signed)
Dana Moran [birth year] [id number]   History:    62 y.o. G3P2A1L3 Married.  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  Has a place at [facility].  [name] in Midwifery studies.  Twin daughter/son graduated.   RP:  Established patient presenting for annual gyn exam    HPI: Postmenopause, well on no HRT.  No PMB.  Left Breast Ca Lumpectomy in 08/2010. BSO in 03/2011 because of Left Breast Cancer ER/PR positive.  Breasts normal. Mammo 03/2022 Neg.  No pelvic pain.  No pain with IC, using lubricant.  Pap Neg 10/2021.  No h/o abnormal Pap, will repeat at 3 years. Urine/BMs normal. BMI 26.67. Exercising regularly. BD Osteopenia mild improvement in 01/2023, with Dr Joylene Draft. Colono 06/2019.    Past medical history,surgical history, family history and social history were all reviewed and documented in the EPIC chart.  Gynecologic History No LMP recorded. Patient is postmenopausal.  Obstetric History OB History  Gravida Para Term Preterm AB Living  3 2     1 3   SAB IAB Ectopic Multiple Live Births  1     1      # Outcome Date GA Lbr Len/2nd Weight Sex Delivery Anes PTL Lv  3 Para           2 Para           1 SAB              ROS: A ROS was performed and pertinent positives and negatives are included in the history. GENERAL: No fevers or chills. HEENT: No change in vision, no earache, sore throat or sinus congestion. NECK: No pain or stiffness. CARDIOVASCULAR: No chest pain or pressure. No palpitations. PULMONARY: No shortness of breath, cough or wheeze. GASTROINTESTINAL: No abdominal pain, nausea, vomiting or diarrhea, melena or bright red blood per rectum. GENITOURINARY: No urinary frequency, urgency, hesitancy or dysuria. MUSCULOSKELETAL: No joint or muscle pain, no back pain, no recent trauma. DERMATOLOGIC: No rash, no itching, no lesions. ENDOCRINE: No polyuria, polydipsia, no heat or cold intolerance. No recent change in weight. HEMATOLOGICAL: No anemia or easy bruising or bleeding. NEUROLOGIC: No headache,  seizures, numbness, tingling or weakness. PSYCHIATRIC: No depression, no loss of interest in normal activity or change in sleep pattern.     Exam:   BP 112/78   Pulse 68   Ht 5' 2.25" (1.581 m)   Wt 147 lb (66.7 kg)   SpO2 99%   BMI 26.67 kg/m   Body mass index is 26.67 kg/m.  General appearance : Well developed well nourished female. No acute distress HEENT: Eyes: no retinal hemorrhage or exudates,  Neck supple, trachea midline, no carotid bruits, no thyroidmegaly Lungs: Clear to auscultation, no rhonchi or wheezes, or rib retractions  Heart: Regular rate and rhythm, no murmurs or gallops Breast:Examined in sitting and supine position were symmetrical in appearance, no palpable masses or tenderness,  no skin retraction, no nipple inversion, no nipple discharge, no skin discoloration, no axillary or supraclavicular lymphadenopathy Abdomen: no palpable masses or tenderness, no rebound or guarding Extremities: no edema or skin discoloration or tenderness  Pelvic: Vulva: Normal             Vagina: No gross lesions or discharge  Cervix: No gross lesions or discharge  Uterus  AV, normal size, shape and consistency, non-tender and mobile  Adnexa  Without masses or tenderness  Anus: Normal   Assessment/Plan:  62 y.o. female for annual exam   1. Well female exam  with routine gynecological exam Postmenopause, well on no HRT.  No PMB.  Left Breast Ca Lumpectomy in 08/2010. BSO in 03/2011 because of Left Breast Cancer ER/PR positive.  Breasts normal. Mammo 03/2022 Neg.  No pelvic pain.  No pain with IC, using lubricant.  Pap Neg 10/2021.  No h/o abnormal Pap, will repeat at 3 years. Urine/BMs normal. BMI 26.67. Exercising regularly. BD Osteopenia mild improvement in 01/2023, with Dr Joylene Draft. Colono 06/2019.  2. Postmenopause Postmenopause, well on no HRT.  No PMB.   3. Status post bilateral salpingo-oophorectomy (BSO)  4. Osteopenia of multiple sites BD Osteopenia mild improvement in  01/2023, with Dr Joylene Draft.  5. Malignant neoplasm of lower-outer quadrant of left breast of female, estrogen receptor positive ([place])  Other orders - ezetimibe (ZETIA) 10 MG tablet; 1 tablet Orally Once a day for 90 days - rosuvastatin (CRESTOR) 20 MG tablet; 1 tablet Oral Once a day for 90 days   Princess Bruins MD, 8:46 AM

## 2023-04-06 DIAGNOSIS — E538 Deficiency of other specified B group vitamins: Secondary | ICD-10-CM | POA: Diagnosis not present

## 2023-04-06 DIAGNOSIS — I10 Essential (primary) hypertension: Secondary | ICD-10-CM | POA: Diagnosis not present

## 2023-04-06 DIAGNOSIS — Q612 Polycystic kidney, adult type: Secondary | ICD-10-CM | POA: Diagnosis not present

## 2023-04-20 ENCOUNTER — Other Ambulatory Visit: Payer: Self-pay | Admitting: Obstetrics & Gynecology

## 2023-04-20 DIAGNOSIS — Z0000 Encounter for general adult medical examination without abnormal findings: Secondary | ICD-10-CM

## 2023-04-22 ENCOUNTER — Ambulatory Visit
Admission: RE | Admit: 2023-04-22 | Discharge: 2023-04-22 | Disposition: A | Discharge status: Home / Self Care | Payer: BC Managed Care – PPO | Source: Ambulatory Visit | Attending: Obstetrics & Gynecology | Admitting: Obstetrics & Gynecology

## 2023-04-22 DIAGNOSIS — Z1231 Encounter for screening mammogram for malignant neoplasm of breast: Secondary | ICD-10-CM | POA: Diagnosis not present

## 2023-04-22 DIAGNOSIS — Z0000 Encounter for general adult medical examination without abnormal findings: Secondary | ICD-10-CM

## 2023-05-20 ENCOUNTER — Encounter: Payer: Self-pay | Admitting: Emergency Medicine

## 2023-05-20 ENCOUNTER — Other Ambulatory Visit: Payer: Self-pay | Admitting: Internal Medicine

## 2023-05-20 DIAGNOSIS — Q612 Polycystic kidney, adult type: Secondary | ICD-10-CM

## 2023-06-02 ENCOUNTER — Ambulatory Visit
Admission: RE | Admit: 2023-06-02 | Discharge: 2023-06-02 | Disposition: A | Discharge status: Home / Self Care | Payer: BLUE CROSS/BLUE SHIELD | Source: Ambulatory Visit | Attending: Internal Medicine | Admitting: Internal Medicine

## 2023-06-02 DIAGNOSIS — Q612 Polycystic kidney, adult type: Secondary | ICD-10-CM

## 2023-06-02 DIAGNOSIS — N281 Cyst of kidney, acquired: Secondary | ICD-10-CM | POA: Diagnosis not present

## 2023-06-03 DIAGNOSIS — I10 Essential (primary) hypertension: Secondary | ICD-10-CM | POA: Diagnosis not present

## 2023-06-03 DIAGNOSIS — Q612 Polycystic kidney, adult type: Secondary | ICD-10-CM | POA: Diagnosis not present

## 2023-06-17 DIAGNOSIS — H524 Presbyopia: Secondary | ICD-10-CM | POA: Diagnosis not present

## 2023-06-17 DIAGNOSIS — H40013 Open angle with borderline findings, low risk, bilateral: Secondary | ICD-10-CM | POA: Diagnosis not present

## 2023-10-22 ENCOUNTER — Encounter: Payer: Self-pay | Admitting: Family Medicine

## 2023-10-22 ENCOUNTER — Ambulatory Visit: Payer: BC Managed Care – PPO | Admitting: Family Medicine

## 2023-10-22 VITALS — BP 124/80 | Ht 62.0 in | Wt 142.0 lb

## 2023-10-22 DIAGNOSIS — M62830 Muscle spasm of back: Secondary | ICD-10-CM

## 2023-10-22 DIAGNOSIS — M461 Sacroiliitis, not elsewhere classified: Secondary | ICD-10-CM

## 2023-10-22 MED ORDER — PREDNISONE 10 MG PO TABS: ORAL_TABLET | ORAL | 0 refills | Status: DC

## 2023-10-22 NOTE — Progress Notes (Addendum)
PCP: Rodrigo Ran, MD  Chief Complaint: Low back pain Subjective:   HPI: Patient is a 62 y.o. female here for low back pain that started last week. Patient is quite active and does Pilates twice a week as well as some weightlifting.  Patient states that last Thursday she did Pilates and then on Friday she did some light lifting.  Patient states that Saturday morning she woke up and had some pain in her lower back and had difficulty with twisting and turning.  Patient states that the pain is sharp but it is localized.  Patient denies any numbness tingling down her legs or into her gluteus region.  Patient does have a history of polycystic kidney disease and states that she is unable to take any anti-inflammatories.  Patient denies any numbness tingling of the lower extremities and no other concerns at this time   Past Medical History:  Diagnosis Date   Breast cancer (HCC) 2011   Cancer (HCC) 08/13/10   brast   Hypertension    Personal history of radiation therapy     Current Outpatient Medications on File Prior to Visit  Medication Sig Dispense Refill   amLODipine (NORVASC) 5 MG tablet Take 5 mg by mouth daily.     Calcium Carbonate-Vitamin D (CALCIUM-VITAMIN D) 500-200 MG-UNIT per tablet Take 1 tablet by mouth 2 (two) times daily with a meal.     chlorthalidone (HYGROTON) 25 MG tablet      Cyanocobalamin (B-12) 1000 MCG CAPS Take by mouth.       diclofenac (VOLTAREN) 75 MG EC tablet Take 1 tablet (75 mg total) by mouth 2 (two) times daily. 50 tablet 2   ezetimibe (ZETIA) 10 MG tablet 1 tablet Orally Once a day for 90 days     Probiotic Product (PROBIOTIC PO) Take by mouth.     rosuvastatin (CRESTOR) 20 MG tablet 1 tablet Oral Once a day for 90 days     telmisartan (MICARDIS) 80 MG tablet Take 80 mg by mouth daily.     venlafaxine XR (EFFEXOR-XR) 75 MG 24 hr capsule Take 1 capsule (75 mg total) by mouth daily with breakfast. 90 capsule 4   No current facility-administered medications on  file prior to visit.    Past Surgical History:  Procedure Laterality Date   BREAST BIOPSY     BREAST LUMPECTOMY Left 2011   BREAST SURGERY  08/28/2010   Lt br lumpectomy   HERNIA REPAIR  age 88    Allergies  Allergen Reactions   Losartan     Other Reaction(s): felt weird on it, chest tighter, didn't feel good on it.    BP 124/80   Ht 5\' 2"  (1.575 m)   Wt 142 lb (64.4 kg)   BMI 25.97 kg/m      06/09/2021    2:26 PM  [facility]  Frequency of aerobic exercise (# of days/week) 7  Average time in minutes 60  Frequency of strengthening activities (# of days/week) 3        No data to display              Objective:  Physical Exam:  Gen: NAD, comfortable in exam room  Lumbar spine:  - Inspection: no gross deformity or scoliosis; no swelling or ecchymosis. No skin changes - Palpation: No TTP over the spinous processes, there is TTP over the paraspinal muscles around L4 on the right side and TTP over the right SI joint. - ROM: full  active ROM of the lumbar spine in flexion and extension without pain - Strength: 5/5 strength of lower extremity in L4-S1 nerve root distributions b/l  *L1/L2: Hip Flexion & Abduction  *L3/L4: Knee Extension  *L4/L5: Ankle Dorsiflexion  *L5: Great Toe Extension  *S1: Ankle Plantar Flexion - Neuro: sensation intact in the L4-S1 nerve root distribution b/l, 2+ L4 and S1 reflexes - Provocative Testing: Negative straight leg raise, Modified Slump Test    Assessment & Plan:  1. SI (sacroiliac) joint inflammation (HCC) (Primary) -Patient's physical exam is consistent with a lumbar paraspinal muscle spasm as well as some SI joint inflammation.  Discussed with patient that given her history of polycystic kidney disease, patient would not tolerate any anti-inflammatories.  Patient would like some relief given the holidays are coming up.  Will give patient back stretches and exercises she can do, we will also prescribe a  prednisone taper.  Patient was advised to apply heat over the affected area.  If there is no improvement patient advised to follow-up.  If patient has improvement of the paraspinal muscles but no improvement in the SI joint inflammation can consider steroid injection in the future.  Patient understanding agreeable with plan.  2. Lumbar paraspinal muscle spasm See A/P above   Brenton Grills MD, PGY-4  Sports Medicine Fellow [facility]  Addendum:  Patient seen and examined in the office by fellow.   History, exam, plan of care were precepted with me.  Agree with findings as documented in fellow note.  Darene Lamer, DO, CAQSM

## 2023-11-30 DIAGNOSIS — D2272 Melanocytic nevi of left lower limb, including hip: Secondary | ICD-10-CM | POA: Diagnosis not present

## 2023-11-30 DIAGNOSIS — L821 Other seborrheic keratosis: Secondary | ICD-10-CM | POA: Diagnosis not present

## 2023-11-30 DIAGNOSIS — L812 Freckles: Secondary | ICD-10-CM | POA: Diagnosis not present

## 2023-11-30 DIAGNOSIS — L820 Inflamed seborrheic keratosis: Secondary | ICD-10-CM | POA: Diagnosis not present

## 2023-11-30 DIAGNOSIS — Z85828 Personal history of other malignant neoplasm of skin: Secondary | ICD-10-CM | POA: Diagnosis not present

## 2023-12-14 DIAGNOSIS — Q612 Polycystic kidney, adult type: Secondary | ICD-10-CM | POA: Diagnosis not present

## 2023-12-14 DIAGNOSIS — I10 Essential (primary) hypertension: Secondary | ICD-10-CM | POA: Diagnosis not present

## 2024-02-16 ENCOUNTER — Other Ambulatory Visit: Payer: Self-pay | Admitting: Obstetrics and Gynecology

## 2024-02-16 DIAGNOSIS — Z1231 Encounter for screening mammogram for malignant neoplasm of breast: Secondary | ICD-10-CM

## 2024-03-20 ENCOUNTER — Ambulatory Visit: Admitting: Obstetrics and Gynecology

## 2024-03-20 ENCOUNTER — Other Ambulatory Visit (HOSPITAL_COMMUNITY)
Admission: RE | Admit: 2024-03-20 | Discharge: 2024-03-20 | Disposition: A | Discharge status: Home / Self Care | Source: Ambulatory Visit | Attending: Obstetrics and Gynecology | Admitting: Obstetrics and Gynecology

## 2024-03-20 ENCOUNTER — Encounter: Payer: Self-pay | Admitting: Obstetrics and Gynecology

## 2024-03-20 VITALS — BP 122/76 | HR 70 | Temp 97.9°F | Ht 63.25 in | Wt 148.6 lb

## 2024-03-20 DIAGNOSIS — C50512 Malignant neoplasm of lower-outer quadrant of left female breast: Secondary | ICD-10-CM

## 2024-03-20 DIAGNOSIS — Z1331 Encounter for screening for depression: Secondary | ICD-10-CM | POA: Diagnosis not present

## 2024-03-20 DIAGNOSIS — M8589 Other specified disorders of bone density and structure, multiple sites: Secondary | ICD-10-CM | POA: Insufficient documentation

## 2024-03-20 DIAGNOSIS — Z90722 Acquired absence of ovaries, bilateral: Secondary | ICD-10-CM | POA: Insufficient documentation

## 2024-03-20 DIAGNOSIS — Z124 Encounter for screening for malignant neoplasm of cervix: Secondary | ICD-10-CM | POA: Diagnosis not present

## 2024-03-20 DIAGNOSIS — Z170 Estrogen receptor positive status [ER+]: Secondary | ICD-10-CM

## 2024-03-20 DIAGNOSIS — Z01419 Encounter for gynecological examination (general) (routine) without abnormal findings: Secondary | ICD-10-CM | POA: Insufficient documentation

## 2024-03-20 NOTE — Patient Instructions (Signed)

## 2024-03-20 NOTE — Assessment & Plan Note (Signed)
 Normal exam. MMG UTD

## 2024-03-20 NOTE — Assessment & Plan Note (Signed)
 Cervical cancer screening performed according to ASCCP guidelines. Encouraged annual mammogram screening Colonoscopy UTD DXA UTD, ordered by PCP Labs and immunizations with her primary Encouraged safe sexual practices as indicated Encouraged healthy lifestyle practices with diet and exercise For patients under 50-62yo, I recommend 1200mg  calcium daily and 600IU of vitamin D  daily.

## 2024-03-20 NOTE — Progress Notes (Signed)
 63 y.o. [id number] postmenopausal female with history of left breast cancer (diagnosed 2011, status post lumpectomy), BSO in 03/2011 because of Left Breast Cancer ER/PR positive, osteopenia here for annual exam. Married. Retired over past year. Recently [facility] home at the [facility].  Parents passed over the past 2 years. Doing well and stress levels are improved. 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 Occasional VMS, manageable  Postmenopausal bleeding: none Pelvic discharge or pain: none Breast mass, nipple discharge or skin changes : none Last PAP:     Component Value Date/Time   DIAGPAP  10/06/2021 0907    - Negative for intraepithelial lesion or malignancy (NILM)   ADEQPAP  10/06/2021 0907    Satisfactory for evaluation; transformation zone component PRESENT.   Last mammogram: 04/22/2023 BI-RADS 1, density C Last DXA: 2024, per pt, ordered by PCP Last colonoscopy: 06/09/2019, Q 7-year Sexually active: yes  Exercising: yes, pilates teacher Smoker:no  Flowsheet Row Office Visit from 03/20/2024 in [facility]  PHQ-2 Total Score 0        GYN HISTORY: Left breast cancer, 2011 BSO  OB History  Gravida Para Term Preterm AB Living  3 2   1 3   SAB IAB Ectopic Multiple Live Births  1   1     # Outcome Date GA Lbr Len/2nd Weight Sex Type Anes PTL Lv  3 Para           2 Para           1 SAB             Past Medical History:  Diagnosis Date   Breast cancer (HCC) 2011   Cancer (HCC) 08/13/10   brast   Hypertension    Personal history of radiation therapy     Past Surgical History:  Procedure Laterality Date   BREAST BIOPSY     BREAST LUMPECTOMY Left 2011   BREAST SURGERY  08/28/2010   Lt br lumpectomy   HERNIA REPAIR  age 26    Current Outpatient Medications on File Prior to Visit  Medication Sig Dispense Refill   amLODipine (NORVASC) 5 MG tablet Take 5 mg by mouth daily.     Calcium Carbonate-Vitamin D   (CALCIUM-VITAMIN D ) 500-200 MG-UNIT per tablet Take 1 tablet by mouth 2 (two) times daily with a meal.     chlorthalidone (HYGROTON) 25 MG tablet      Cyanocobalamin  (B-12) 1000 MCG CAPS Take by mouth.       diclofenac  (VOLTAREN ) 75 MG EC tablet Take 1 tablet (75 mg total) by mouth 2 (two) times daily. 50 tablet 2   ezetimibe (ZETIA) 10 MG tablet 1 tablet Orally Once a day for 90 days     Probiotic Product (PROBIOTIC PO) Take by mouth.     rosuvastatin (CRESTOR) 20 MG tablet 1 tablet Oral Once a day for 90 days     telmisartan (MICARDIS) 80 MG tablet Take 80 mg by mouth daily.     venlafaxine  XR (EFFEXOR -XR) 75 MG 24 hr capsule Take 1 capsule (75 mg total) by mouth daily with breakfast. 90 capsule 4   No current facility-administered medications on file prior to visit.    Social History   Socioeconomic History   Marital status: Married    Spouse name: Not on file   Number of children: Not on file   Years of education: Not on file   Highest education level: Not on file  Occupational  History   Not on file  Tobacco Use   Smoking status: Former   Smokeless tobacco: Former    Quit date: 01/15/1972  Vaping Use   Vaping status: Never Used  Substance and Sexual Activity   Alcohol  use: Yes    Comment: 2 glasses wine a day   Drug use: No   Sexual activity: Yes    Birth control/protection: Post-menopausal    Comment: 1st intercourse 63 yo-Fewer than 5 partners   Other Topics Concern   Not on file  Social History Narrative   Not on file   Social Drivers of Health   Financial Resource Strain: Not on file  Food Insecurity: Not on file  Transportation Needs: Not on file  Physical Activity: Not on file  Stress: Not on file  Social Connections: Not on file  Intimate Partner Violence: Not on file    Family History  Problem Relation Age of Onset   Kidney disease Mother    Hypertension Father     Allergies  Allergen Reactions   Losartan     Other Reaction(s): felt weird on it,  chest tighter, didn't feel good on it.      PE Today's Vitals   03/20/24 1536  BP: 122/76  Pulse: 70  Temp: 97.9 F (36.6 C)  TempSrc: Oral  SpO2: 98%  Weight: 148 lb 9.6 oz (67.4 kg)  Height: 5' 3.25" (1.607 m)   Body mass index is 26.12 kg/m.  Physical Exam Vitals reviewed. Exam conducted with a chaperone present.  Constitutional:      General: She is not in acute distress.    Appearance: Normal appearance.  HENT:     Head: Normocephalic and atraumatic.     Nose: Nose normal.  Eyes:     Extraocular Movements: Extraocular movements intact.     Conjunctiva/sclera: Conjunctivae normal.  Neck:     Thyroid: No thyroid mass, thyromegaly or thyroid tenderness.  Pulmonary:     Effort: Pulmonary effort is normal.  Chest:     Chest wall: No mass or tenderness.  Breasts:    Right: Normal. No swelling, mass, nipple discharge, skin change or tenderness.     Left: Normal. No swelling, mass, nipple discharge, skin change or tenderness.       Comments: Left lumpectomy and SLND scar Abdominal:     General: There is no distension.     Palpations: Abdomen is soft.     Tenderness: There is no abdominal tenderness.  Genitourinary:    General: Normal vulva.     Exam position: Lithotomy position.     Urethra: No prolapse.     Vagina: Normal. No vaginal discharge or bleeding.     Cervix: Normal. No lesion.     Uterus: Normal. Not enlarged and not tender.      Adnexa: Right adnexa normal and left adnexa normal.  Musculoskeletal:        General: Normal range of motion.     Cervical back: Normal range of motion.  Lymphadenopathy:     Upper Body:     Right upper body: No axillary adenopathy.     Left upper body: No axillary adenopathy.     Lower Body: No right inguinal adenopathy. No left inguinal adenopathy.  Skin:    General: Skin is warm and dry.  Neurological:     General: No focal deficit present.     Mental Status: She is alert.  Psychiatric:        Mood and Affect:  Mood normal.        Behavior: Behavior normal.       Assessment and Plan:        Well woman exam with routine gynecological exam Assessment & Plan: Cervical cancer screening performed according to ASCCP guidelines. Encouraged annual mammogram screening Colonoscopy UTD DXA UTD, ordered by PCP Labs and immunizations with her primary Encouraged safe sexual practices as indicated Encouraged healthy lifestyle practices with diet and exercise For patients under 50-70yo, I recommend 1200mg  calcium daily and 600IU of vitamin D  daily.    Cervical cancer screening -     Cytology - PAP  Malignant neoplasm of lower-outer quadrant of left breast of female, estrogen receptor positive (HCC) Assessment & Plan: Normal exam. MMG UTD   Negative depression screening  Status post bilateral salpingo-oophorectomy (BSO)  Osteopenia of multiple sites Assessment & Plan: Continue vitamin D +Calcium Encouraged weight based exercise DXA due 2026, ordered by PCP    Romaine Closs, MD

## 2024-03-20 NOTE — Assessment & Plan Note (Signed)
 Continue vitamin D +Calcium Encouraged weight based exercise DXA due 2026, ordered by PCP

## 2024-03-22 LAB — CYTOLOGY - PAP
Comment: NEGATIVE
Diagnosis: NEGATIVE
High risk HPV: NEGATIVE

## 2024-03-23 ENCOUNTER — Ambulatory Visit: Payer: Self-pay | Admitting: Obstetrics and Gynecology

## 2024-03-23 DIAGNOSIS — R7301 Impaired fasting glucose: Secondary | ICD-10-CM | POA: Diagnosis not present

## 2024-03-23 DIAGNOSIS — E785 Hyperlipidemia, unspecified: Secondary | ICD-10-CM | POA: Diagnosis not present

## 2024-03-23 DIAGNOSIS — E559 Vitamin D deficiency, unspecified: Secondary | ICD-10-CM | POA: Diagnosis not present

## 2024-03-23 DIAGNOSIS — Z1212 Encounter for screening for malignant neoplasm of rectum: Secondary | ICD-10-CM | POA: Diagnosis not present

## 2024-03-23 DIAGNOSIS — D519 Vitamin B12 deficiency anemia, unspecified: Secondary | ICD-10-CM | POA: Diagnosis not present

## 2024-03-31 DIAGNOSIS — Z0000 Encounter for general adult medical examination without abnormal findings: Secondary | ICD-10-CM | POA: Diagnosis not present

## 2024-03-31 DIAGNOSIS — D519 Vitamin B12 deficiency anemia, unspecified: Secondary | ICD-10-CM | POA: Diagnosis not present

## 2024-03-31 DIAGNOSIS — I10 Essential (primary) hypertension: Secondary | ICD-10-CM | POA: Diagnosis not present

## 2024-03-31 DIAGNOSIS — Z1339 Encounter for screening examination for other mental health and behavioral disorders: Secondary | ICD-10-CM | POA: Diagnosis not present

## 2024-03-31 DIAGNOSIS — R82998 Other abnormal findings in urine: Secondary | ICD-10-CM | POA: Diagnosis not present

## 2024-03-31 DIAGNOSIS — Z1331 Encounter for screening for depression: Secondary | ICD-10-CM | POA: Diagnosis not present

## 2024-04-24 ENCOUNTER — Ambulatory Visit
Admission: RE | Admit: 2024-04-24 | Discharge: 2024-04-24 | Discharge status: Home / Self Care | Source: Ambulatory Visit | Attending: Obstetrics and Gynecology

## 2024-04-24 DIAGNOSIS — Z1231 Encounter for screening mammogram for malignant neoplasm of breast: Secondary | ICD-10-CM

## 2024-04-26 ENCOUNTER — Ambulatory Visit

## 2024-04-27 ENCOUNTER — Ambulatory Visit: Payer: Self-pay | Admitting: Obstetrics and Gynecology
